# Patient Record
Sex: Female | Born: 1990
Health system: Southern US, Community
[De-identification: ages and names within clinical notes are randomized; demographics above are authoritative.]

## PROBLEM LIST (undated history)

## (undated) DIAGNOSIS — Z3490 Encounter for supervision of normal pregnancy, unspecified, unspecified trimester: Secondary | ICD-10-CM

## (undated) DIAGNOSIS — Z6740 Type O blood, Rh positive: Secondary | ICD-10-CM

## (undated) DIAGNOSIS — Z9889 Other specified postprocedural states: Secondary | ICD-10-CM

## (undated) DIAGNOSIS — T7840XA Allergy, unspecified, initial encounter: Secondary | ICD-10-CM

## (undated) HISTORY — PX: OTHER SURGICAL HISTORY: SHX169

## (undated) HISTORY — DX: Type O blood, Rh positive: Z67.40

## (undated) HISTORY — PX: ADENOIDECTOMY: SUR15

## (undated) HISTORY — DX: Allergy, unspecified, initial encounter: T78.40XA

## (undated) HISTORY — DX: Encounter for supervision of normal pregnancy, unspecified, unspecified trimester: Z34.90

## (undated) HISTORY — PX: EYE SURGERY: SHX253

## (undated) HISTORY — PX: WISDOM TOOTH EXTRACTION: SHX21

---
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
## 2013-11-03 ENCOUNTER — Other Ambulatory Visit: Payer: Self-pay | Admitting: Physician Assistant

## 2013-11-03 DIAGNOSIS — E041 Nontoxic single thyroid nodule: Secondary | ICD-10-CM

## 2013-11-07 ENCOUNTER — Ambulatory Visit
Admission: RE | Admit: 2013-11-07 | Discharge: 2013-11-07 | Disposition: A | Discharge status: Home / Self Care | Payer: BC Managed Care – PPO | Source: Ambulatory Visit | Attending: Physician Assistant | Admitting: Physician Assistant

## 2013-11-07 DIAGNOSIS — E041 Nontoxic single thyroid nodule: Secondary | ICD-10-CM

## 2015-01-22 IMAGING — US US SOFT TISSUE HEAD/NECK
1 series · 14 of 25 positions shown · non-contrast
Comparison: None.

CLINICAL DATA: Multinodular goiter

EXAM:
THYROID ULTRASOUND
TECHNIQUE: Ultrasound examination of the thyroid gland and adjacent soft
tissues was performed.

[Series 1: us soft tissue head/neck · 0.08mm/px · 14 of 53 slices shown]
[im 1/53]
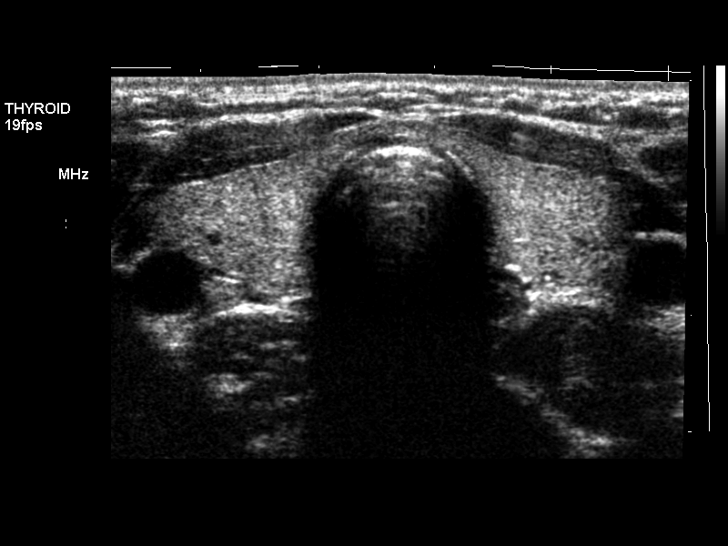
[im 5/53]
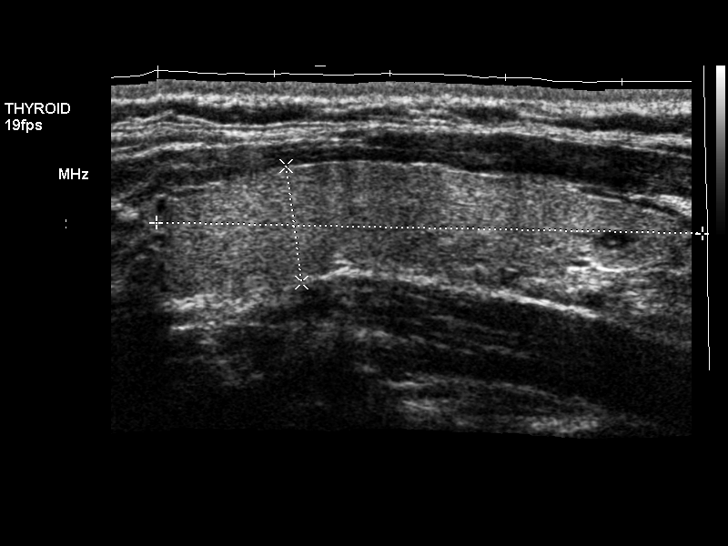
[im 9/53]
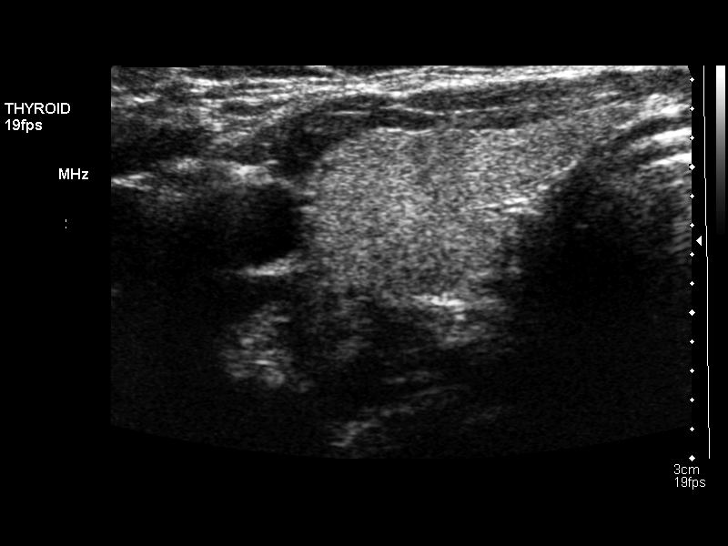
[im 14/53]
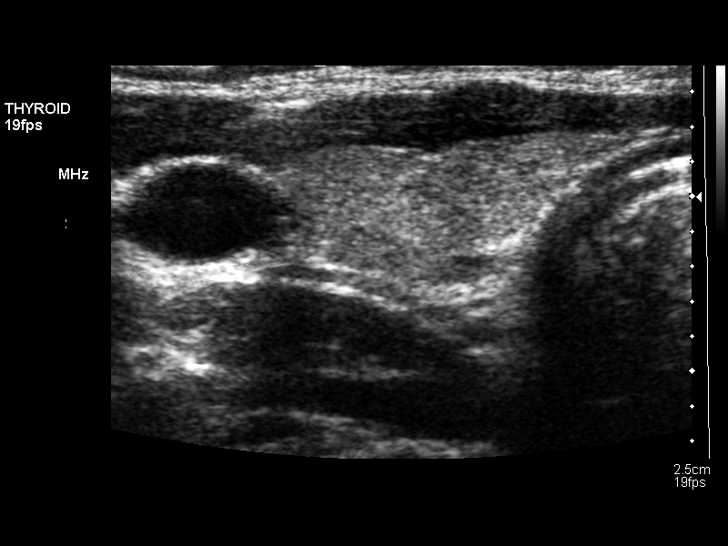
[im 18/53]
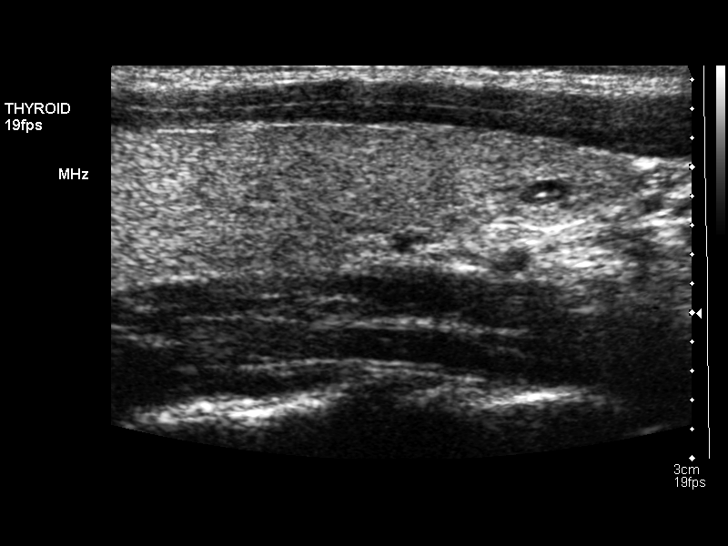
[im 20/53]
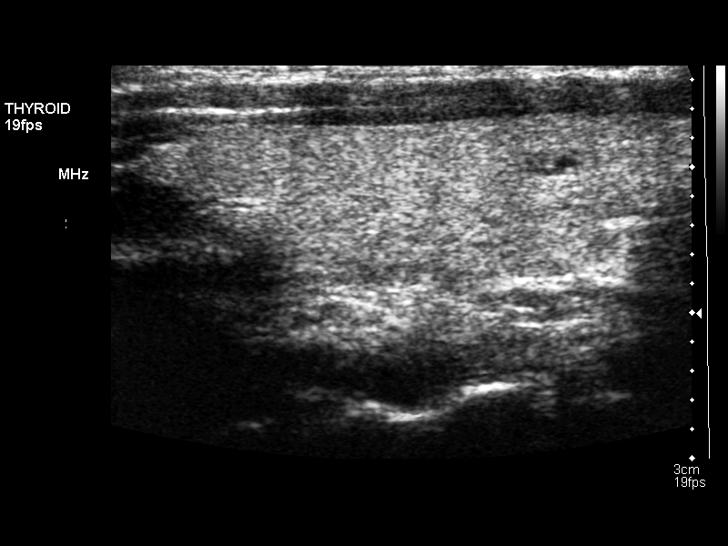
[im 24/53]
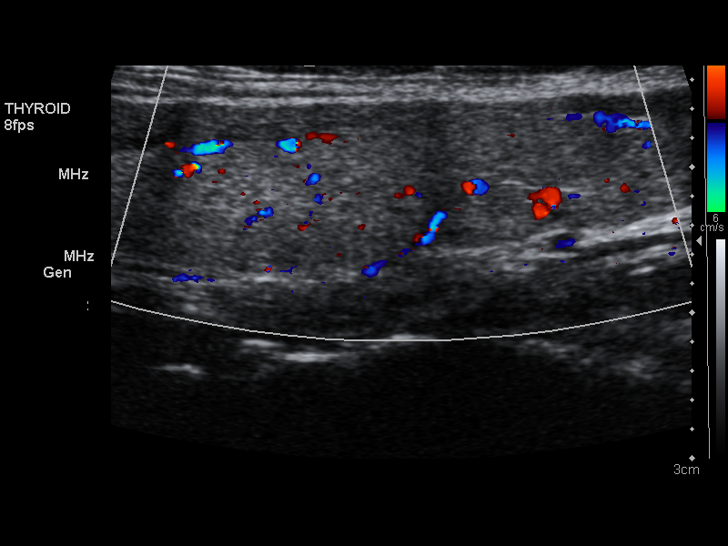
[im 29/53]
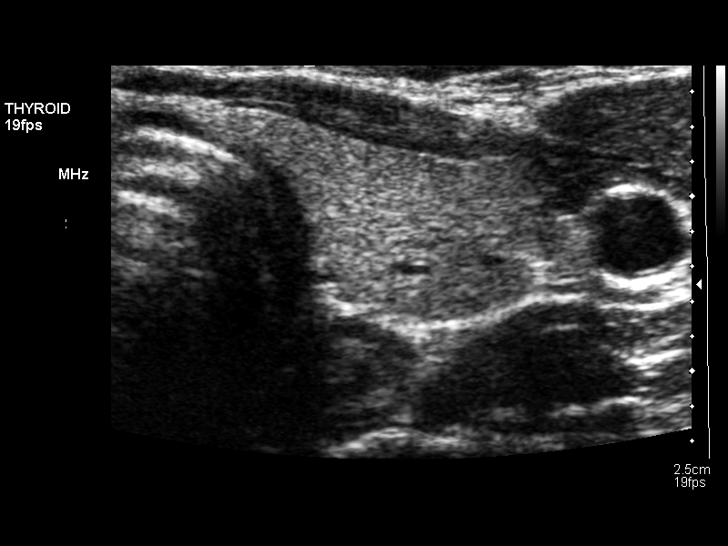
[im 33/53]
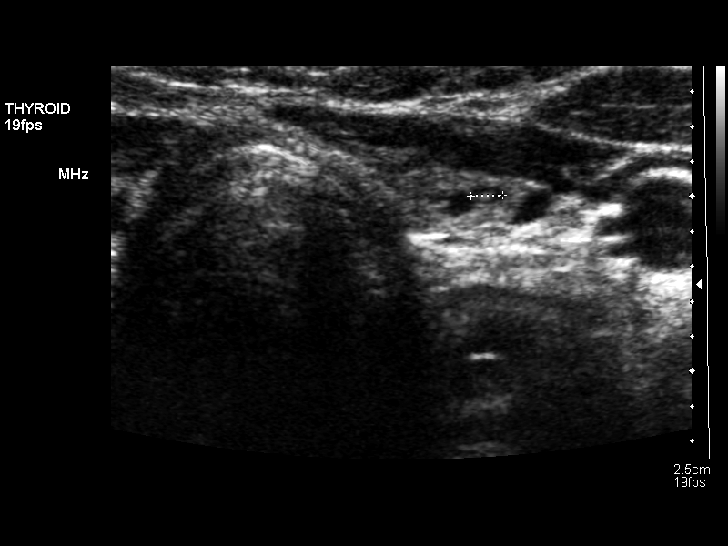
[im 35/53]
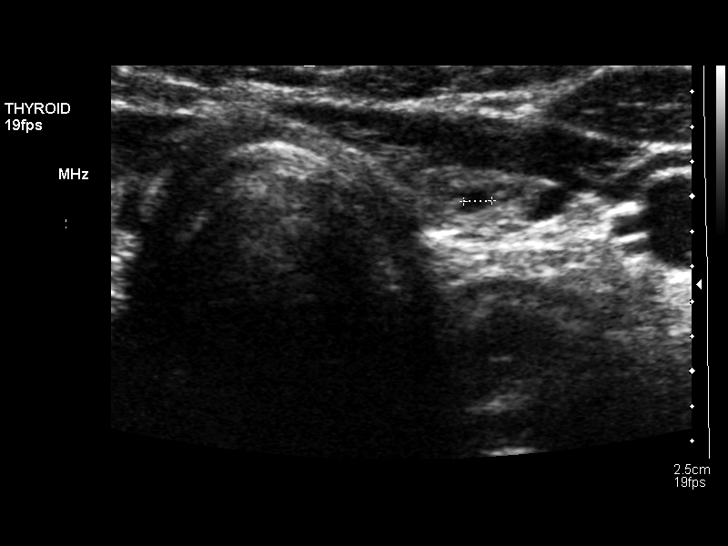
[im 40/53]
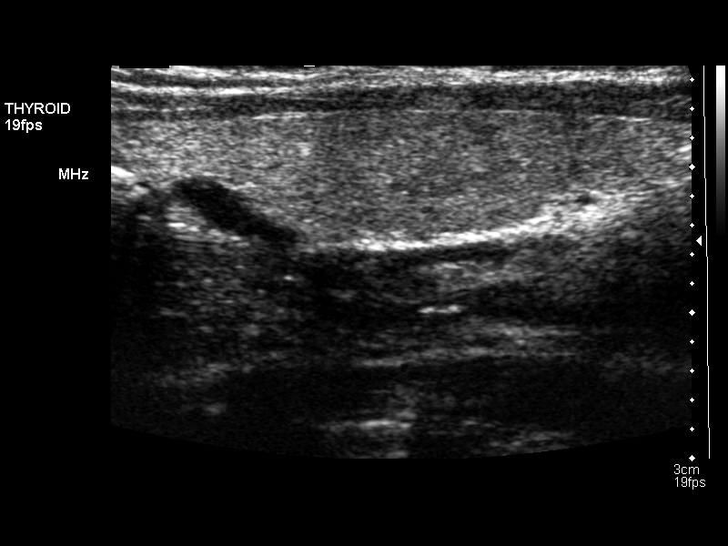
[im 44/53]
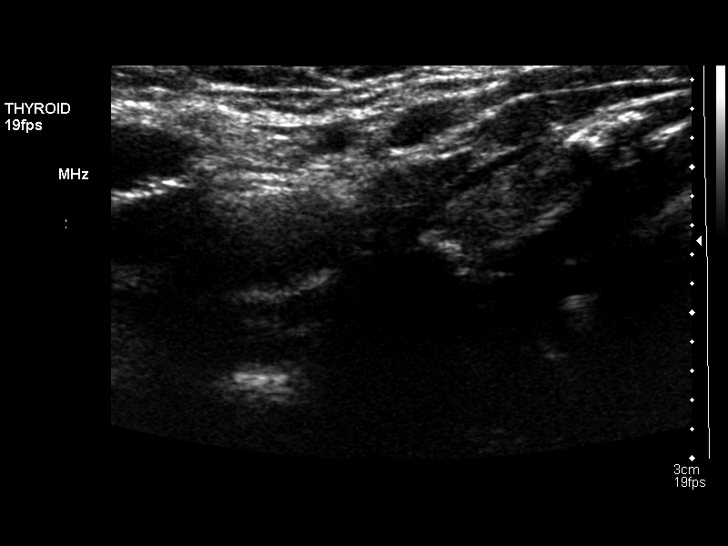
[im 48/53]
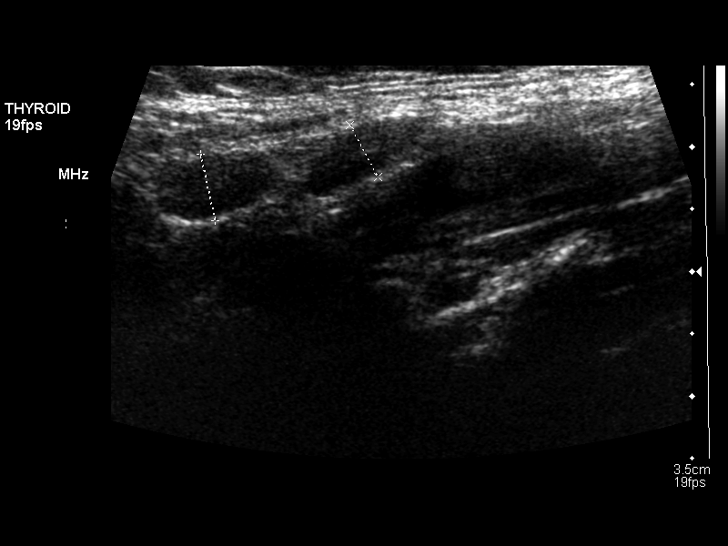
[im 53/53]
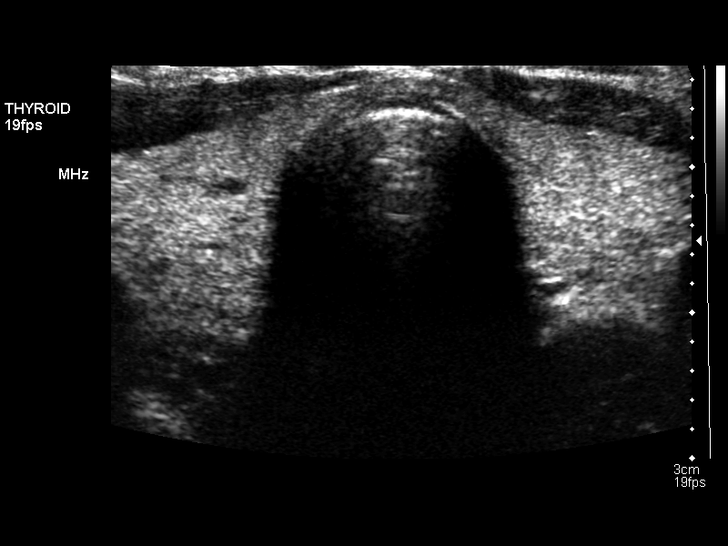

[14 of 25 positions shown; findings below may reference images not displayed]

FINDINGS: Right thyroid lobe

Measurements: 4.7 x 1.0 x 1.7 cm.. Multiple small hypoechoic nodules
are identified. The largest of these measures 4 mm.

Left thyroid lobe

Measurements: 5.2 x 1.1 x 1.5 cm.. Multiple small nodules are noted.
The largest of these measures 3.5 mm.

Isthmus

Thickness: 1.6 mm..  No nodules visualized.

Lymphadenopathy

None visualized.
IMPRESSION: Multiple small nodules bilaterally. Followup examination is
recommended to assess for stability in 1 year.

Findings do not meet current SRU consensus criteria for biopsy.
Follow-up by clinical exam is recommended.Reference: Management of
Thyroid Nodules Detected at US: Society of Radiologists in
Ultrasound Consensus Conference Statement. Radiology 8660;

## 2016-05-10 ENCOUNTER — Encounter: Payer: Self-pay | Admitting: Physician Assistant

## 2016-05-10 ENCOUNTER — Ambulatory Visit: Payer: Self-pay | Admitting: Physician Assistant

## 2016-05-10 VITALS — BP 112/70 | HR 100 | Temp 98.8°F

## 2016-05-10 DIAGNOSIS — J0100 Acute maxillary sinusitis, unspecified: Secondary | ICD-10-CM

## 2016-05-10 MED ORDER — FLUTICASONE PROPIONATE 50 MCG/ACT NA SUSP: 2.0000 | Freq: Every day | NASAL | 6 refills | Status: DC

## 2016-05-10 MED ORDER — AZITHROMYCIN 250 MG PO TABS: ORAL_TABLET | ORAL | 0 refills | Status: DC

## 2016-05-10 NOTE — Progress Notes (Signed)
S: C/o runny nose and congestion for 2 days, no fever, chills, cp/sob, v/d; mucus is yellow/green and thick, cough is sporadic, c/o of facial and dental pain.   Using otc meds:   O: PE: vitals wnl, nad, perrl eomi, normocephalic, tms dull, nasal mucosa red and swollen, throat injected, neck supple no lymph, lungs c t a, cv rrr, neuro intact  A:  Acute sinusitis   P: drink fluids, continue regular meds , use otc meds of choice, return if not improving in 5 days, return earlier if worsening , zpack flonase

## 2016-07-19 ENCOUNTER — Ambulatory Visit: Payer: Self-pay | Admitting: Physician Assistant

## 2016-07-19 VITALS — BP 120/60 | HR 82 | Temp 98.8°F

## 2016-07-19 DIAGNOSIS — R110 Nausea: Secondary | ICD-10-CM

## 2016-07-19 LAB — POCT URINALYSIS DIPSTICK
BILIRUBIN UA: NEGATIVE
GLUCOSE UA: NEGATIVE
KETONES UA: NEGATIVE
Leukocytes, UA: NEGATIVE
Nitrite, UA: NEGATIVE
Protein, UA: NEGATIVE
RBC UA: NEGATIVE
SPEC GRAV UA: 1.015
Urobilinogen, UA: 0.2
pH, UA: 5.5

## 2016-07-19 LAB — POCT URINE PREGNANCY: Preg Test, Ur: NEGATIVE

## 2016-07-19 NOTE — Progress Notes (Signed)
S: c/o nausea and hot feeling, felt like she was going to faint, no fever/chills/cough/congestion, has had a sore throat, just started her period today, checked her glucose at work and it was 130 and then later 98, no hx of diabetes, no uti sx Remainder ros neg  O: vitals wnl, nad, tms clear, throat injected, neck supple no lymph, lungs c t a, cv rrr, ua wnl, urine preg neg  A: viral illness  P: f/u if worsening, if sx continue see reg md for labs

## 2016-07-21 ENCOUNTER — Encounter: Payer: Self-pay | Admitting: Physician Assistant

## 2016-07-21 ENCOUNTER — Telehealth: Payer: Self-pay | Admitting: Physician Assistant

## 2016-07-21 ENCOUNTER — Ambulatory Visit: Payer: Self-pay | Admitting: Physician Assistant

## 2016-07-21 VITALS — BP 120/70 | HR 74 | Temp 98.4°F

## 2016-07-21 DIAGNOSIS — J0100 Acute maxillary sinusitis, unspecified: Secondary | ICD-10-CM

## 2016-07-21 MED ORDER — PREDNISONE 10 MG PO TABS: 30.0000 mg | ORAL_TABLET | Freq: Every day | ORAL | 0 refills | Status: DC

## 2016-07-21 MED ORDER — CEFDINIR 300 MG PO CAPS: 300.0000 mg | ORAL_CAPSULE | Freq: Two times a day (BID) | ORAL | 0 refills | Status: DC

## 2016-07-21 NOTE — Progress Notes (Signed)
S: C/o runny nose and congestion for 2 days, no fever, chills, cp/sob, v/d; mucus is green and thick, cough is sporadic, c/o of facial and dental pain. Nausea from last visit is better  Using otc meds:   O: PE: vitals wnl, nad, perrl eomi, normocephalic, tms dull, nasal mucosa red and swollen, throat injected, neck supple no lymph, lungs c t a, cv rrr, neuro intact, pt sounds congested today  A:  Acute sinusitis   P: drink fluids, continue regular meds , use otc meds of choice, return if not improving in 5 days, return earlier if worsening , omnicef, pred 30mg  qd

## 2016-07-21 NOTE — Telephone Encounter (Signed)
Is she just wanting nausea meds?

## 2016-08-28 ENCOUNTER — Encounter: Payer: Self-pay | Admitting: Physician Assistant

## 2016-08-28 ENCOUNTER — Ambulatory Visit: Payer: Self-pay | Admitting: Physician Assistant

## 2016-08-28 VITALS — BP 129/60 | HR 100 | Temp 98.3°F

## 2016-08-28 DIAGNOSIS — N390 Urinary tract infection, site not specified: Secondary | ICD-10-CM

## 2016-08-28 DIAGNOSIS — R319 Hematuria, unspecified: Principal | ICD-10-CM

## 2016-08-28 LAB — POCT URINALYSIS DIPSTICK
BILIRUBIN UA: NEGATIVE
Glucose, UA: NEGATIVE
Ketones, UA: NEGATIVE
NITRITE UA: NEGATIVE
PH UA: 6 (ref 5.0–8.0)
Spec Grav, UA: 1.025 (ref 1.010–1.025)
UROBILINOGEN UA: 0.2 U/dL

## 2016-08-28 MED ORDER — CIPROFLOXACIN HCL 250 MG PO TABS: 250.0000 mg | ORAL_TABLET | Freq: Two times a day (BID) | ORAL | 0 refills | Status: DC

## 2016-08-28 NOTE — Progress Notes (Addendum)
S:  C/o uti sx for 2 days, burning, urgency, frequency, denies vaginal discharge, abdominal pain or flank pain:  Remainder ros neg  O:  Vitals wnl, nad, no cva tenderness, back nontender, lungs c t a,cv rrr, n/v intact, ua 2+ leuks, +blood  A: uti  P: cipro  bid x 7d, increase water intake, add cranberry juice, return if not improving in 2 -3 days, return earlier if worsening, discussed pyelonephritis sx, rx phoned in to [facility]

## 2016-08-28 NOTE — Addendum Note (Signed)
Addended by: Faythe Ghee on: 08/28/2016 05:03 PM   Modules accepted: Orders

## 2017-03-12 ENCOUNTER — Ambulatory Visit: Payer: Self-pay | Admitting: Physician Assistant

## 2017-03-12 ENCOUNTER — Encounter: Payer: Self-pay | Admitting: Physician Assistant

## 2017-03-12 VITALS — BP 122/70 | HR 100 | Temp 98.5°F

## 2017-03-12 DIAGNOSIS — R110 Nausea: Secondary | ICD-10-CM

## 2017-03-12 LAB — POCT URINE PREGNANCY: Preg Test, Ur: NEGATIVE

## 2017-03-12 NOTE — Progress Notes (Signed)
S: c/o nausea on and off for 3-4 weeks, no fever/chills, no vomiting or diarrhea, no cough or congestion, works in [employer] so looked at her gallbladder earlier today and it was normal, nausea not associated with anything in particular  O: vitals wnl, nad, lungs c t a, cv rrr, abd soft nontender bs normal all 4 quads, urine preg negative  A: nausea  P: check 1st morning urine with ept, if negative, f/u with pcp for additional testing

## 2017-03-14 DIAGNOSIS — Z6830 Body mass index (BMI) 30.0-30.9, adult: Secondary | ICD-10-CM | POA: Diagnosis not present

## 2017-03-14 DIAGNOSIS — R112 Nausea with vomiting, unspecified: Secondary | ICD-10-CM | POA: Diagnosis not present

## 2017-04-25 DIAGNOSIS — Z6830 Body mass index (BMI) 30.0-30.9, adult: Secondary | ICD-10-CM | POA: Diagnosis not present

## 2017-04-25 DIAGNOSIS — G43A0 Cyclical vomiting, not intractable: Secondary | ICD-10-CM | POA: Diagnosis not present

## 2017-05-25 DIAGNOSIS — K219 Gastro-esophageal reflux disease without esophagitis: Secondary | ICD-10-CM | POA: Diagnosis not present

## 2017-05-25 DIAGNOSIS — Z1331 Encounter for screening for depression: Secondary | ICD-10-CM | POA: Diagnosis not present

## 2017-05-25 DIAGNOSIS — Z6829 Body mass index (BMI) 29.0-29.9, adult: Secondary | ICD-10-CM | POA: Diagnosis not present

## 2017-08-10 DIAGNOSIS — Z6829 Body mass index (BMI) 29.0-29.9, adult: Secondary | ICD-10-CM | POA: Diagnosis not present

## 2017-08-10 DIAGNOSIS — Z803 Family history of malignant neoplasm of breast: Secondary | ICD-10-CM | POA: Diagnosis not present

## 2017-08-10 DIAGNOSIS — Z800 Family history of malignant neoplasm of digestive organs: Secondary | ICD-10-CM | POA: Diagnosis not present

## 2017-08-10 DIAGNOSIS — Z8042 Family history of malignant neoplasm of prostate: Secondary | ICD-10-CM | POA: Diagnosis not present

## 2017-08-10 DIAGNOSIS — Z01419 Encounter for gynecological examination (general) (routine) without abnormal findings: Secondary | ICD-10-CM | POA: Diagnosis not present

## 2017-08-24 DIAGNOSIS — K219 Gastro-esophageal reflux disease without esophagitis: Secondary | ICD-10-CM | POA: Diagnosis not present

## 2017-08-24 DIAGNOSIS — Z6829 Body mass index (BMI) 29.0-29.9, adult: Secondary | ICD-10-CM | POA: Diagnosis not present

## 2017-09-28 DIAGNOSIS — Z809 Family history of malignant neoplasm, unspecified: Secondary | ICD-10-CM | POA: Diagnosis not present

## 2017-10-26 ENCOUNTER — Ambulatory Visit: Payer: Self-pay | Admitting: Medical

## 2017-10-26 ENCOUNTER — Encounter: Payer: Self-pay | Admitting: Medical

## 2017-10-26 VITALS — BP 124/80 | HR 79 | Temp 98.5°F | Wt 158.0 lb

## 2017-10-26 DIAGNOSIS — J0110 Acute frontal sinusitis, unspecified: Secondary | ICD-10-CM

## 2017-10-26 DIAGNOSIS — H6983 Other specified disorders of Eustachian tube, bilateral: Secondary | ICD-10-CM

## 2017-10-26 MED ORDER — AZITHROMYCIN 250 MG PO TABS: ORAL_TABLET | ORAL | 0 refills | Status: DC

## 2017-10-26 MED ORDER — FLUTICASONE PROPIONATE 50 MCG/ACT NA SUSP: 2.0000 | Freq: Every day | NASAL | 6 refills | Status: DC

## 2017-10-26 NOTE — Patient Instructions (Addendum)
Sinusitis, Adult Sinusitis is soreness and inflammation of your sinuses. Sinuses are hollow spaces in the bones around your face. They are located:  Around your eyes.  In the middle of your forehead.  Behind your nose.  In your cheekbones.  Your sinuses and nasal passages are lined with a stringy fluid (mucus). Mucus normally drains out of your sinuses. When your nasal tissues get inflamed or swollen, the mucus can get trapped or blocked so air cannot flow through your sinuses. This lets bacteria, viruses, and funguses grow, and that leads to infection. Follow these instructions at home: Medicines  Take, use, or apply over-the-counter and prescription medicines only as told by your doctor. These may include nasal sprays.  If you were prescribed an antibiotic medicine, take it as told by your doctor. Do not stop taking the antibiotic even if you start to feel better. Hydrate and Humidify  Drink enough water to keep your pee (urine) clear or pale yellow.  Use a cool mist humidifier to keep the humidity level in your home above 50%.  Breathe in steam for 10-15 minutes, 3-4 times a day or as told by your doctor. You can do this in the bathroom while a hot shower is running.  Try not to spend time in cool or dry air. Rest  Rest as much as possible.  Sleep with your head raised (elevated).  Make sure to get enough sleep each night. General instructions  Put a warm, moist washcloth on your face 3-4 times a day or as told by your doctor. This will help with discomfort.  Wash your hands often with soap and water. If there is no soap and water, use hand sanitizer.  Do not smoke. Avoid being around people who are smoking (secondhand smoke).  Keep all follow-up visits as told by your doctor. This is important. Contact a doctor if:  You have a fever.  Your symptoms get worse.  Your symptoms do not get better within 10 days. Get help right away if:  You have a very bad  headache.  You cannot stop throwing up (vomiting).  You have pain or swelling around your face or eyes.  You have trouble seeing.  You feel confused.  Your neck is stiff.  You have trouble breathing. This information is not intended to replace advice given to you by your health care provider. Make sure you discuss any questions you have with your health care provider. Document Released: 10/18/2007 Document Revised: 12/26/2015 Document Reviewed: 02/24/2015 Elsevier Interactive Patient Education  2018 [employer] Eustachian Tube Dysfunction The eustachian tube connects the middle ear to the back of the nose. It regulates air pressure in the middle ear by allowing air to move between the ear and nose. It also helps to drain fluid from the middle ear space. When the eustachian tube does not function properly, air pressure, fluid, or both can build up in the middle ear. Eustachian tube dysfunction can affect one or both ears. What are the causes? This condition happens when the eustachian tube becomes blocked or cannot open normally. This may result from:  Ear infections.  Colds and other upper respiratory infections.  Allergies.  Irritation, such as from cigarette smoke or acid from the stomach coming up into the esophagus (gastroesophageal reflux).  Sudden changes in air pressure, such as from descending in an airplane.  Abnormal growths in the nose or throat, such as nasal polyps, tumors, or enlarged tissue at the back of the throat (adenoids).    What increases the risk? This condition may be more likely to develop in people who smoke and people who are overweight. Eustachian tube dysfunction may also be more likely to develop in children, especially children who have:  Certain birth defects of the mouth, such as cleft palate.  Large tonsils and adenoids.  What are the signs or symptoms? Symptoms of this condition may include:  A feeling of fullness in the ear.  Ear  pain.  Clicking or popping noises in the ear.  Ringing in the ear.  Hearing loss.  Loss of balance.  Symptoms may get worse when the air pressure around you changes, such as when you travel to an area of high elevation or fly on an airplane. How is this diagnosed? This condition may be diagnosed based on:  Your symptoms.  A physical exam of your ear, nose, and throat.  Tests, such as those that measure: ? The movement of your eardrum (tympanogram). ? Your hearing (audiometry).  How is this treated? Treatment depends on the cause and severity of your condition. If your symptoms are mild, you may be able to relieve your symptoms by moving air into ("popping") your ears. If you have symptoms of fluid in your ears, treatment may include:  Decongestants.  Antihistamines.  Nasal sprays or ear drops that contain medicines that reduce swelling (steroids).  In some cases, you may need to have a procedure to drain the fluid in your eardrum (myringotomy). In this procedure, a small tube is placed in the eardrum to:  Drain the fluid.  Restore the air in the middle ear space.  Follow these instructions at home:  Take over-the-counter and prescription medicines only as told by your health care provider.  Use techniques to help pop your ears as recommended by your health care provider. These may include: ? Chewing gum. ? Yawning. ? Frequent, forceful swallowing. ? Closing your mouth, holding your nose closed, and gently blowing as if you are trying to blow air out of your nose.  Do not do any of the following until your health care provider approves: ? Travel to high altitudes. ? Fly in airplanes. ? Work in a pressurized cabin or room. ? Scuba dive.  Keep your ears dry. Dry your ears completely after showering or bathing.  Do not smoke.  Keep all follow-up visits as told by your health care provider. This is important. Contact a health care provider if:  Your symptoms do  not go away after treatment.  Your symptoms come back after treatment.  You are unable to pop your ears.  You have: ? A fever. ? Pain in your ear. ? Pain in your head or neck. ? Fluid draining from your ear.  Your hearing suddenly changes.  You become very dizzy.  You lose your balance. This information is not intended to replace advice given to you by your health care provider. Make sure you discuss any questions you have with your health care provider. Document Released: 05/28/2015 Document Revised: 10/07/2015 Document Reviewed: 05/20/2014 Elsevier Interactive Patient Education  2018 Elsevier Inc.  

## 2017-10-26 NOTE — Addendum Note (Signed)
Addended by: Ellie LunchATCLIFFE, Dasja Brase R on: 10/26/2017 09:38 AM   Modules accepted: Orders

## 2017-10-26 NOTE — Progress Notes (Signed)
   Subjective:    Patient ID: Kelly Crawford, female    DOB: May 13, 1991, 27 y.o.   MRN: [id number]  HPI 27 yo female in non acute distress. Complains of initially st ,PND, runny nose and sneezing x  7days  and  Now  facial pressure and ears (yesterday) feel like she is in a tunnel, green discharge from the throat. Using Mucinexx and Allergra. Denies any shortness of breath or chest pain, fever or chills. Review of Systems  Constitutional: Negative for chills and fever.  HENT: Positive for congestion, postnasal drip, rhinorrhea, sinus pain (forehead and maxillary regions), sneezing, sore throat (off and on) and voice change (hoarse). Negative for ear discharge and ear pain.   Eyes: Negative for discharge, itching and visual disturbance.  Respiratory: Positive for cough (productive green). Negative for shortness of breath.   Cardiovascular: Negative for chest pain, palpitations and leg swelling.  Gastrointestinal: Negative for abdominal pain, diarrhea, nausea and vomiting.  Endocrine: Negative for polydipsia, polyphagia and polyuria.  Genitourinary: Negative for dysuria.  Musculoskeletal: Negative for myalgias.  Skin: Negative for rash.  Allergic/Immunologic: Positive for environmental allergies and food allergies (milk hives beter now then when she was a child).  Neurological: Positive for headaches. Negative for dizziness, syncope and light-headedness.  Hematological: Negative for adenopathy.  Psychiatric/Behavioral: Negative for behavioral problems, hallucinations, self-injury and suicidal ideas.   Pt not pregnant or trying to conceive. Has PCP in [place].    Objective:   Physical Exam  Constitutional: She is oriented to person, place, and time. She appears well-developed and well-nourished.  HENT:  Head: Normocephalic and atraumatic.  Right Ear: Hearing, external ear and ear canal normal. A middle ear effusion is present.  Left Ear: Hearing, external ear and ear canal normal. A  middle ear effusion is present.  Nose: Mucosal edema and rhinorrhea present. Right sinus exhibits frontal sinus tenderness. Left sinus exhibits frontal sinus tenderness.  Mouth/Throat: Oropharynx is clear and moist.  Eyes: Pupils are equal, round, and reactive to light. Conjunctivae, EOM and lids are normal.  Neck: Normal range of motion.  Cardiovascular: Normal rate, regular rhythm and normal heart sounds. Exam reveals no gallop and no friction rub.  No murmur heard. Pulmonary/Chest: Effort normal and breath sounds normal.  Lymphadenopathy:    She has cervical adenopathy.  Neurological: She is alert and oriented to person, place, and time.  Skin: Skin is warm and dry.  Psychiatric: She has a normal mood and affect. Her behavior is normal. Judgment and thought content normal.  Nursing note and vitals reviewed.         Assessment & Plan:  Sinusitis, eustachian tube dysfunction Continue Allegra and Mucinex as directed. Take OTC Motrin or Tylenol as directed for headache and general not feeling well. Meds ordered this encounter  Medications  . azithromycin (ZITHROMAX) 250 MG tablet    Sig: Take two tablet by mouth today then one tablet days 2-5, take with food    Dispense:  6 tablet    Refill:  0  . fluticasone (FLONASE) 50 MCG/ACT nasal spray    Sig: Place 2 sprays into both nostrils daily.    Dispense:  16 g    Refill:  6  Return in 3-5 days if not improving. Patient verbalizes understanding and has no questions at discharge.

## 2017-10-30 ENCOUNTER — Telehealth: Payer: Self-pay | Admitting: Emergency Medicine

## 2017-10-30 NOTE — Telephone Encounter (Signed)
Left message follow up call from visit with [facility]. 

## 2018-05-13 DIAGNOSIS — H5213 Myopia, bilateral: Secondary | ICD-10-CM | POA: Diagnosis not present

## 2018-06-05 LAB — HM HEPATITIS C SCREENING LAB: HM Hepatitis Screen: NEGATIVE

## 2018-06-07 LAB — HM PAP SMEAR: HM Pap smear: NORMAL

## 2018-06-24 ENCOUNTER — Encounter: Payer: Self-pay | Admitting: Internal Medicine

## 2018-10-04 ENCOUNTER — Encounter: Payer: Self-pay | Admitting: Internal Medicine

## 2018-10-04 LAB — HIV ANTIBODY (ROUTINE TESTING W REFLEX): HIV 1&2 Ab, 4th Generation: NORMAL

## 2018-10-04 LAB — HM HIV SCREENING LAB: HM HIV Screening: NEGATIVE

## 2018-10-24 ENCOUNTER — Telehealth: Payer: Self-pay

## 2018-10-24 NOTE — Telephone Encounter (Signed)
Copied from [place] [id number]. Topic: General - Other >> Oct 23, 2018  4:19 PM Virl Axe D wrote: Reason for CRM: Pt returned VM regarding appt on 10/25/18. Office closed. Please advise.

## 2018-10-25 ENCOUNTER — Ambulatory Visit (INDEPENDENT_AMBULATORY_CARE_PROVIDER_SITE_OTHER): Payer: No Typology Code available for payment source | Admitting: Internal Medicine

## 2018-10-25 ENCOUNTER — Encounter: Payer: Self-pay | Admitting: Internal Medicine

## 2018-10-25 ENCOUNTER — Other Ambulatory Visit: Payer: Self-pay

## 2018-10-25 DIAGNOSIS — E611 Iron deficiency: Secondary | ICD-10-CM | POA: Diagnosis not present

## 2018-10-25 DIAGNOSIS — Z3A30 30 weeks gestation of pregnancy: Secondary | ICD-10-CM

## 2018-10-25 DIAGNOSIS — Z0000 Encounter for general adult medical examination without abnormal findings: Secondary | ICD-10-CM | POA: Diagnosis not present

## 2018-10-25 DIAGNOSIS — Z13220 Encounter for screening for lipoid disorders: Secondary | ICD-10-CM | POA: Diagnosis not present

## 2018-10-25 DIAGNOSIS — E559 Vitamin D deficiency, unspecified: Secondary | ICD-10-CM

## 2018-10-25 DIAGNOSIS — Z1329 Encounter for screening for other suspected endocrine disorder: Secondary | ICD-10-CM

## 2018-10-25 NOTE — Progress Notes (Addendum)
Virtual Visit via Video Note  I connected with Kelly Crawford   on 10/25/18 at  3:47 PM EDT by a video enabled telemedicine application and verified that I am speaking with the correct person using two identifiers.  Location patient: work [employer] Persons participating in the virtual visit: patient, provider  I discussed the limitations of evaluation and management by telemedicine and the availability of in person appointments. The patient expressed understanding and agreed to proceed.   HPI: 1. Annual doing well no issues she is pregnant [redacted] weeks and due 12/29/18 ob/gyn Dr. Gaetano Net [facility] in [place]   ROS: See pertinent positives and negatives per HPI. General: weight gain with pregnancy  HEENT: no sore throat  CV: no chest pain  Lungs: no sob  Ab: no abdominal pain  Neuro: no h/a/dizziness  Psych: no depression/anxiety   +pregnancy  Past Medical History:  Diagnosis Date  . Allergy    bee venom, pollen, dust, mold and mildew  . Pregnancy    due 12/29/2018 1st child    Past Surgical History:  Procedure Laterality Date  . ADENOIDECTOMY    . tubes in ears    . WISDOM TOOTH EXTRACTION      Family History  Problem Relation Age of Onset  . Cancer Mother        breast dx'ed age 45  . Hyperlipidemia Maternal Grandmother   . Hypertension Maternal Grandmother   . Diabetes Maternal Grandmother        pre  . Cancer Maternal Grandfather        prostate  . Hypertension Maternal Grandfather   . Hyperlipidemia Maternal Grandfather   . Diabetes Maternal Grandfather        pre    SOCIAL HX:  Married  Expected baby girl 12/29/2018  Works [employer] in [place] dept  Likes spending time with family and shopping  From [place] [phone]  Current Outpatient Medications:  .  Docosahexaenoic Acid (PRENATAL DHA) 200 MG CAPS, Prenatal + DHA, Disp: , Rfl:  .  etonogestrel-ethinyl estradiol (NUVARING) 0.12-0.015 MG/24HR vaginal ring, Place 1 each vaginally  every 28 (twenty-eight) days. Insert vaginally and leave in place for 3 consecutive weeks, then remove for 1 week., Disp: , Rfl:   EXAM:  VITALS per patient if applicable:  GENERAL: alert, oriented, appears well and in no acute distress  HEENT: atraumatic, conjunttiva clear, no obvious abnormalities on inspection of external nose and ears  NECK: normal movements of the head and neck  LUNGS: on inspection no signs of respiratory distress, breathing rate appears normal, no obvious gross SOB, gasping or wheezing  CV: no obvious cyanosis  MS: moves all visible extremities without noticeable abnormality  PSYCH/NEURO: pleasant and cooperative, no obvious depression or anxiety, speech and thought processing grossly intact  ASSESSMENT AND PLAN:  Discussed the following assessment and plan:  Annual physical exam - Plan: rec healthy diet and exercise  Check flu in future Tdap had 10/04/18   Pap per pt had 06/2018 need to copy copy requested today from ob/gyn  -pap 06/07/18 negative no HPV testing done scanned into chart   Will check with Ob/gyn for labs and if not done check CMET, CBC, iron, TSH, T4, UA, vitamin D, lipid, MMR, hep B  -HIV neg 10/04/18, HCV neg 05/30/18, HBsAg negative  CBC 8.3 13.0/38.6/190  Blood type O+ Rubella abs 3.69 + [redacted] weeks gestation of pregnancy - Plan: f/u ob/gyn Dr. Gaetano Net  Flu shot  Tdap  I discussed the assessment and treatment plan with the patient. The patient was provided an opportunity to ask questions and all were answered. The patient agreed with the plan and demonstrated an understanding of the instructions.   The patient was advised to call back or seek an in-person evaluation if the symptoms worsen or if the condition fails to improve as anticipated.  Time spent 15 minutes  Delorise Jackson, MD

## 2018-10-25 NOTE — Patient Instructions (Signed)

## 2018-11-01 NOTE — Addendum Note (Signed)
Addended by: Orland Mustard on: 11/01/2018 06:22 PM   Modules accepted: Orders

## 2018-11-04 ENCOUNTER — Telehealth: Payer: Self-pay | Admitting: Internal Medicine

## 2018-11-04 NOTE — Telephone Encounter (Signed)
Called pt and left vm for pt to call ofc to schedule fasting labs for this Friday and f/u in 7-8 month.

## 2018-11-04 NOTE — Progress Notes (Signed)
Called pt and left msg for pt to call ofc to schedule fasting labs for Friday.

## 2018-11-22 ENCOUNTER — Other Ambulatory Visit (INDEPENDENT_AMBULATORY_CARE_PROVIDER_SITE_OTHER): Payer: No Typology Code available for payment source

## 2018-11-22 ENCOUNTER — Other Ambulatory Visit: Payer: Self-pay

## 2018-11-22 ENCOUNTER — Encounter: Payer: Self-pay | Admitting: Internal Medicine

## 2018-11-22 DIAGNOSIS — Z0000 Encounter for general adult medical examination without abnormal findings: Secondary | ICD-10-CM

## 2018-11-22 DIAGNOSIS — E611 Iron deficiency: Secondary | ICD-10-CM

## 2018-11-22 DIAGNOSIS — Z1329 Encounter for screening for other suspected endocrine disorder: Secondary | ICD-10-CM | POA: Diagnosis not present

## 2018-11-22 DIAGNOSIS — E785 Hyperlipidemia, unspecified: Secondary | ICD-10-CM | POA: Insufficient documentation

## 2018-11-22 DIAGNOSIS — Z13220 Encounter for screening for lipoid disorders: Secondary | ICD-10-CM | POA: Diagnosis not present

## 2018-11-22 DIAGNOSIS — E559 Vitamin D deficiency, unspecified: Secondary | ICD-10-CM | POA: Insufficient documentation

## 2018-11-22 LAB — COMPREHENSIVE METABOLIC PANEL
ALT: 17 U/L (ref 0–35)
AST: 15 U/L (ref 0–37)
Albumin: 3.5 g/dL (ref 3.5–5.2)
Alkaline Phosphatase: 108 U/L (ref 39–117)
BUN: 11 mg/dL (ref 6–23)
CO2: 22 mEq/L (ref 19–32)
Calcium: 8.4 mg/dL (ref 8.4–10.5)
Chloride: 106 mEq/L (ref 96–112)
Creatinine, Ser: 0.67 mg/dL (ref 0.40–1.20)
GFR: 105.56 mL/min (ref >60.00)
Glucose, Bld: 76 mg/dL (ref 70–99)
Potassium: 4.1 mEq/L (ref 3.5–5.1)
Sodium: 138 mEq/L (ref 135–145)
Total Bilirubin: 0.7 mg/dL (ref 0.2–1.2)
Total Protein: 6.1 g/dL (ref 6.0–8.3)

## 2018-11-22 LAB — LIPID PANEL
Cholesterol: 276 mg/dL — ABNORMAL HIGH (ref 0–200)
HDL: 93.7 mg/dL (ref >39.00)
NonHDL: 182.44
Total CHOL/HDL Ratio: 3
Triglycerides: 214 mg/dL — ABNORMAL HIGH (ref 0.0–149.0)
VLDL: 42.8 mg/dL — ABNORMAL HIGH (ref 0.0–40.0)

## 2018-11-22 LAB — IBC + FERRITIN
Ferritin: 27.3 ng/mL (ref 10.0–291.0)
Iron: 153 ug/dL — ABNORMAL HIGH (ref 42–145)
Saturation Ratios: 29.7 % (ref 20.0–50.0)
Transferrin: 368 mg/dL — ABNORMAL HIGH (ref 212.0–360.0)

## 2018-11-22 LAB — CBC WITH DIFFERENTIAL/PLATELET
Basophils Absolute: 0 10*3/uL (ref 0.0–0.1)
Basophils Relative: 0.4 % (ref 0.0–3.0)
Eosinophils Absolute: 0.1 10*3/uL (ref 0.0–0.7)
Eosinophils Relative: 1.1 % (ref 0.0–5.0)
HCT: 42.2 % (ref 36.0–46.0)
Hemoglobin: 14.3 g/dL (ref 12.0–15.0)
Lymphocytes Relative: 18.4 % (ref 12.0–46.0)
Lymphs Abs: 1.5 10*3/uL (ref 0.7–4.0)
MCHC: 33.9 g/dL (ref 30.0–36.0)
MCV: 98.2 fl (ref 78.0–100.0)
Monocytes Absolute: 0.7 10*3/uL (ref 0.1–1.0)
Monocytes Relative: 8 % (ref 3.0–12.0)
Neutro Abs: 6 10*3/uL (ref 1.4–7.7)
Neutrophils Relative %: 72.1 % (ref 43.0–77.0)
Platelets: 176 10*3/uL (ref 150.0–400.0)
RBC: 4.29 Mil/uL (ref 3.87–5.11)
RDW: 13.7 % (ref 11.5–15.5)
WBC: 8.3 10*3/uL (ref 4.0–10.5)

## 2018-11-22 LAB — LDL CHOLESTEROL, DIRECT: Direct LDL: 169 mg/dL

## 2018-11-22 LAB — TSH: TSH: 2.06 u[IU]/mL (ref 0.35–4.50)

## 2018-11-22 LAB — VITAMIN D 25 HYDROXY (VIT D DEFICIENCY, FRACTURES): VITD: 24.44 ng/mL — ABNORMAL LOW (ref 30.00–100.00)

## 2018-11-22 LAB — T4, FREE: Free T4: 0.65 ng/dL (ref 0.60–1.60)

## 2018-12-06 LAB — OB RESULTS CONSOLE GBS: GBS: NEGATIVE

## 2018-12-08 ENCOUNTER — Other Ambulatory Visit: Payer: Self-pay

## 2018-12-08 ENCOUNTER — Encounter (HOSPITAL_COMMUNITY): Payer: Self-pay

## 2018-12-08 ENCOUNTER — Inpatient Hospital Stay (HOSPITAL_COMMUNITY): Payer: No Typology Code available for payment source | Admitting: Anesthesiology

## 2018-12-08 ENCOUNTER — Inpatient Hospital Stay (HOSPITAL_COMMUNITY)
Admission: AD | Admit: 2018-12-08 | Discharge: 2018-12-12 | DRG: 788 | Disposition: A | Discharge status: Home / Self Care | Payer: No Typology Code available for payment source | Attending: Obstetrics and Gynecology | Admitting: Obstetrics and Gynecology

## 2018-12-08 ENCOUNTER — Encounter (HOSPITAL_COMMUNITY)
Admission: AD | Disposition: A | Discharge status: Home / Self Care | Payer: Self-pay | Source: Home / Self Care | Attending: Obstetrics and Gynecology

## 2018-12-08 DIAGNOSIS — Z3A37 37 weeks gestation of pregnancy: Secondary | ICD-10-CM

## 2018-12-08 DIAGNOSIS — Z20828 Contact with and (suspected) exposure to other viral communicable diseases: Secondary | ICD-10-CM | POA: Diagnosis present

## 2018-12-08 DIAGNOSIS — O26893 Other specified pregnancy related conditions, third trimester: Secondary | ICD-10-CM | POA: Diagnosis present

## 2018-12-08 LAB — TYPE AND SCREEN
ABO/RH(D): O POS
Antibody Screen: NEGATIVE

## 2018-12-08 LAB — CBC
HCT: 41.5 % (ref 36.0–46.0)
Hemoglobin: 14.1 g/dL (ref 12.0–15.0)
MCH: 33.1 pg (ref 26.0–34.0)
MCHC: 34 g/dL (ref 30.0–36.0)
MCV: 97.4 fL (ref 80.0–100.0)
Platelets: 173 10*3/uL (ref 150–400)
RBC: 4.26 MIL/uL (ref 3.87–5.11)
RDW: 13.2 % (ref 11.5–15.5)
WBC: 8.7 10*3/uL (ref 4.0–10.5)
nRBC: 0 % (ref 0.0–0.2)

## 2018-12-08 LAB — SARS CORONAVIRUS 2 BY RT PCR (HOSPITAL ORDER, PERFORMED IN ~~LOC~~ HOSPITAL LAB): SARS Coronavirus 2: NEGATIVE

## 2018-12-08 LAB — POCT FERN TEST: POCT Fern Test: POSITIVE

## 2018-12-08 SURGERY — Surgical Case
Anesthesia: Spinal

## 2018-12-08 MED ORDER — NALOXONE HCL 0.4 MG/ML IJ SOLN: 0.4000 mg | INTRAMUSCULAR | Status: DC | PRN

## 2018-12-08 MED ORDER — MORPHINE SULFATE (PF) 0.5 MG/ML IJ SOLN
INTRAMUSCULAR | Status: DC | PRN
  Administered 2018-12-08: .15 mg via INTRATHECAL

## 2018-12-08 MED ORDER — DIPHENHYDRAMINE HCL 25 MG PO CAPS: 25.0000 mg | ORAL_CAPSULE | ORAL | Status: DC | PRN

## 2018-12-08 MED ORDER — OXYCODONE HCL 5 MG PO TABS
5.0000 mg | ORAL_TABLET | ORAL | Status: DC | PRN
  Administered 2018-12-11 – 2018-12-12 (×4): 5 mg via ORAL
  Filled 2018-12-08 (×3): qty 1

## 2018-12-08 MED ORDER — SOD CITRATE-CITRIC ACID 500-334 MG/5ML PO SOLN
30.0000 mL | ORAL | Status: DC | PRN
  Administered 2018-12-08: 30 mL via ORAL
  Filled 2018-12-08: qty 30

## 2018-12-08 MED ORDER — MEASLES, MUMPS & RUBELLA VAC IJ SOLR: 0.5000 mL | Freq: Once | INTRAMUSCULAR | Status: DC

## 2018-12-08 MED ORDER — NALBUPHINE HCL 10 MG/ML IJ SOLN: 5.0000 mg | INTRAMUSCULAR | Status: DC | PRN

## 2018-12-08 MED ORDER — LACTATED RINGERS IV SOLN
INTRAVENOUS | Status: DC
  Administered 2018-12-09: 01:00:00 via INTRAVENOUS

## 2018-12-08 MED ORDER — SODIUM CHLORIDE 0.9% FLUSH: 3.0000 mL | INTRAVENOUS | Status: DC | PRN

## 2018-12-08 MED ORDER — ONDANSETRON HCL 4 MG/2ML IJ SOLN
INTRAMUSCULAR | Status: AC
  Filled 2018-12-08: qty 2

## 2018-12-08 MED ORDER — ZOLPIDEM TARTRATE 5 MG PO TABS: 5.0000 mg | ORAL_TABLET | Freq: Every evening | ORAL | Status: DC | PRN

## 2018-12-08 MED ORDER — IBUPROFEN 600 MG PO TABS
600.0000 mg | ORAL_TABLET | Freq: Four times a day (QID) | ORAL | Status: DC | PRN
  Administered 2018-12-09 – 2018-12-12 (×11): 600 mg via ORAL
  Filled 2018-12-08 (×11): qty 1

## 2018-12-08 MED ORDER — KETOROLAC TROMETHAMINE 30 MG/ML IJ SOLN: 30.0000 mg | Freq: Four times a day (QID) | INTRAMUSCULAR | Status: AC | PRN

## 2018-12-08 MED ORDER — MORPHINE SULFATE (PF) 0.5 MG/ML IJ SOLN
INTRAMUSCULAR | Status: AC
  Filled 2018-12-08: qty 10

## 2018-12-08 MED ORDER — ONDANSETRON HCL 4 MG/2ML IJ SOLN: 4.0000 mg | Freq: Four times a day (QID) | INTRAMUSCULAR | Status: DC | PRN

## 2018-12-08 MED ORDER — PHENYLEPHRINE HCL-NACL 20-0.9 MG/250ML-% IV SOLN
INTRAVENOUS | Status: DC | PRN
  Administered 2018-12-08: 60 ug/min via INTRAVENOUS

## 2018-12-08 MED ORDER — FENTANYL CITRATE (PF) 100 MCG/2ML IJ SOLN: 25.0000 ug | INTRAMUSCULAR | Status: DC | PRN

## 2018-12-08 MED ORDER — WITCH HAZEL-GLYCERIN EX PADS: 1.0000 "application " | MEDICATED_PAD | CUTANEOUS | Status: DC | PRN

## 2018-12-08 MED ORDER — ACETAMINOPHEN 10 MG/ML IV SOLN
1000.0000 mg | Freq: Once | INTRAVENOUS | Status: DC | PRN
  Administered 2018-12-08: 1000 mg via INTRAVENOUS

## 2018-12-08 MED ORDER — ONDANSETRON HCL 4 MG/2ML IJ SOLN
INTRAMUSCULAR | Status: DC | PRN
  Administered 2018-12-08: 4 mg via INTRAVENOUS

## 2018-12-08 MED ORDER — DIBUCAINE (PERIANAL) 1 % EX OINT: 1.0000 "application " | TOPICAL_OINTMENT | CUTANEOUS | Status: DC | PRN

## 2018-12-08 MED ORDER — SIMETHICONE 80 MG PO CHEW
80.0000 mg | CHEWABLE_TABLET | Freq: Three times a day (TID) | ORAL | Status: DC
  Administered 2018-12-09 – 2018-12-12 (×8): 80 mg via ORAL
  Filled 2018-12-08 (×9): qty 1

## 2018-12-08 MED ORDER — OXYTOCIN BOLUS FROM INFUSION: 500.0000 mL | Freq: Once | INTRAVENOUS | Status: DC

## 2018-12-08 MED ORDER — ONDANSETRON HCL 4 MG/2ML IJ SOLN: 4.0000 mg | Freq: Three times a day (TID) | INTRAMUSCULAR | Status: DC | PRN

## 2018-12-08 MED ORDER — DIPHENHYDRAMINE HCL 25 MG PO CAPS: 25.0000 mg | ORAL_CAPSULE | Freq: Four times a day (QID) | ORAL | Status: DC | PRN

## 2018-12-08 MED ORDER — SIMETHICONE 80 MG PO CHEW
80.0000 mg | CHEWABLE_TABLET | ORAL | Status: DC
  Administered 2018-12-08 – 2018-12-11 (×3): 80 mg via ORAL
  Filled 2018-12-08 (×4): qty 1

## 2018-12-08 MED ORDER — OXYCODONE-ACETAMINOPHEN 5-325 MG PO TABS: 2.0000 | ORAL_TABLET | ORAL | Status: DC | PRN

## 2018-12-08 MED ORDER — NALBUPHINE HCL 10 MG/ML IJ SOLN: 5.0000 mg | Freq: Once | INTRAMUSCULAR | Status: DC | PRN

## 2018-12-08 MED ORDER — OXYTOCIN 40 UNITS IN NORMAL SALINE INFUSION - SIMPLE MED
INTRAVENOUS | Status: AC
  Filled 2018-12-08: qty 1000

## 2018-12-08 MED ORDER — DIPHENHYDRAMINE HCL 50 MG/ML IJ SOLN: 12.5000 mg | INTRAMUSCULAR | Status: DC | PRN

## 2018-12-08 MED ORDER — KETOROLAC TROMETHAMINE 30 MG/ML IJ SOLN
30.0000 mg | Freq: Once | INTRAMUSCULAR | Status: AC | PRN
  Administered 2018-12-08: 30 mg via INTRAVENOUS

## 2018-12-08 MED ORDER — BUPIVACAINE IN DEXTROSE 0.75-8.25 % IT SOLN
INTRATHECAL | Status: DC | PRN
  Administered 2018-12-08: 1.6 mL via INTRATHECAL

## 2018-12-08 MED ORDER — LACTATED RINGERS IV SOLN
INTRAVENOUS | Status: DC
  Administered 2018-12-08 (×2): via INTRAVENOUS

## 2018-12-08 MED ORDER — SIMETHICONE 80 MG PO CHEW: 80.0000 mg | CHEWABLE_TABLET | ORAL | Status: DC | PRN

## 2018-12-08 MED ORDER — ACETAMINOPHEN 10 MG/ML IV SOLN
INTRAVENOUS | Status: AC
  Filled 2018-12-08: qty 100

## 2018-12-08 MED ORDER — OXYTOCIN 40 UNITS IN NORMAL SALINE INFUSION - SIMPLE MED: 2.5000 [IU]/h | INTRAVENOUS | Status: AC

## 2018-12-08 MED ORDER — ACETAMINOPHEN 325 MG PO TABS: 650.0000 mg | ORAL_TABLET | ORAL | Status: DC | PRN

## 2018-12-08 MED ORDER — NALOXONE HCL 4 MG/10ML IJ SOLN
1.0000 ug/kg/h | INTRAVENOUS | Status: DC | PRN
  Filled 2018-12-08: qty 5

## 2018-12-08 MED ORDER — FENTANYL CITRATE (PF) 100 MCG/2ML IJ SOLN
INTRAMUSCULAR | Status: DC | PRN
  Administered 2018-12-08: 15 ug via INTRATHECAL

## 2018-12-08 MED ORDER — SODIUM CHLORIDE 0.9 % IV SOLN
INTRAVENOUS | Status: DC | PRN
  Administered 2018-12-08: 14:00:00 via INTRAVENOUS

## 2018-12-08 MED ORDER — ACETAMINOPHEN 500 MG PO TABS
1000.0000 mg | ORAL_TABLET | Freq: Four times a day (QID) | ORAL | Status: AC
  Administered 2018-12-08 – 2018-12-09 (×3): 1000 mg via ORAL
  Filled 2018-12-08 (×3): qty 2

## 2018-12-08 MED ORDER — PRENATAL MULTIVITAMIN CH
1.0000 | ORAL_TABLET | Freq: Every day | ORAL | Status: DC
  Administered 2018-12-09 – 2018-12-11 (×2): 1 via ORAL
  Filled 2018-12-08 (×3): qty 1

## 2018-12-08 MED ORDER — FLEET ENEMA 7-19 GM/118ML RE ENEM: 1.0000 | ENEMA | RECTAL | Status: DC | PRN

## 2018-12-08 MED ORDER — FENTANYL CITRATE (PF) 100 MCG/2ML IJ SOLN
INTRAMUSCULAR | Status: AC
  Filled 2018-12-08: qty 2

## 2018-12-08 MED ORDER — TETANUS-DIPHTH-ACELL PERTUSSIS 5-2.5-18.5 LF-MCG/0.5 IM SUSP: 0.5000 mL | Freq: Once | INTRAMUSCULAR | Status: DC

## 2018-12-08 MED ORDER — OXYCODONE-ACETAMINOPHEN 5-325 MG PO TABS: 1.0000 | ORAL_TABLET | ORAL | Status: DC | PRN

## 2018-12-08 MED ORDER — SENNOSIDES-DOCUSATE SODIUM 8.6-50 MG PO TABS
2.0000 | ORAL_TABLET | ORAL | Status: DC
  Administered 2018-12-08 – 2018-12-11 (×4): 2 via ORAL
  Filled 2018-12-08 (×4): qty 2

## 2018-12-08 MED ORDER — LIDOCAINE HCL (PF) 1 % IJ SOLN: 30.0000 mL | INTRAMUSCULAR | Status: DC | PRN

## 2018-12-08 MED ORDER — CEFOTETAN DISODIUM-DEXTROSE 2-2.08 GM-%(50ML) IV SOLR
INTRAVENOUS | Status: AC
  Filled 2018-12-08: qty 50

## 2018-12-08 MED ORDER — LACTATED RINGERS IV SOLN: 500.0000 mL | INTRAVENOUS | Status: DC | PRN

## 2018-12-08 MED ORDER — COCONUT OIL OIL: 1.0000 "application " | TOPICAL_OIL | Status: DC | PRN

## 2018-12-08 MED ORDER — SCOPOLAMINE 1 MG/3DAYS TD PT72
1.0000 | MEDICATED_PATCH | Freq: Once | TRANSDERMAL | Status: AC
  Administered 2018-12-08: 1.5 mg via TRANSDERMAL
  Filled 2018-12-08: qty 1

## 2018-12-08 MED ORDER — OXYTOCIN 40 UNITS IN NORMAL SALINE INFUSION - SIMPLE MED: 2.5000 [IU]/h | INTRAVENOUS | Status: DC

## 2018-12-08 MED ORDER — KETOROLAC TROMETHAMINE 30 MG/ML IJ SOLN
INTRAMUSCULAR | Status: AC
  Filled 2018-12-08: qty 1

## 2018-12-08 MED ORDER — TRAMADOL HCL 50 MG PO TABS
50.0000 mg | ORAL_TABLET | Freq: Four times a day (QID) | ORAL | Status: DC | PRN
  Administered 2018-12-09 – 2018-12-10 (×3): 50 mg via ORAL
  Filled 2018-12-08 (×3): qty 1

## 2018-12-08 MED ORDER — SODIUM CHLORIDE 0.9 % IV SOLN
INTRAVENOUS | Status: DC | PRN
  Administered 2018-12-08: 40 [IU] via INTRAVENOUS

## 2018-12-08 MED ORDER — SODIUM CHLORIDE 0.9 % IV SOLN
2.0000 g | Freq: Once | INTRAVENOUS | Status: AC
  Administered 2018-12-08: 2 g via INTRAVENOUS
  Filled 2018-12-08: qty 2

## 2018-12-08 MED ORDER — MENTHOL 3 MG MT LOZG: 1.0000 | LOZENGE | OROMUCOSAL | Status: DC | PRN

## 2018-12-08 SURGICAL SUPPLY — 28 items
CHLORAPREP W/TINT 26ML (MISCELLANEOUS) ×2 IMPLANT
CLAMP CORD UMBIL (MISCELLANEOUS) IMPLANT
CLOTH BEACON ORANGE TIMEOUT ST (SAFETY) ×2 IMPLANT
DRSG OPSITE POSTOP 4X10 (GAUZE/BANDAGES/DRESSINGS) ×2 IMPLANT
ELECT REM PT RETURN 9FT ADLT (ELECTROSURGICAL) ×2
ELECTRODE REM PT RTRN 9FT ADLT (ELECTROSURGICAL) ×1 IMPLANT
EXTRACTOR VACUUM M CUP 4 TUBE (SUCTIONS) IMPLANT
GLOVE BIOGEL PI IND STRL 7.0 (GLOVE) ×1 IMPLANT
GLOVE BIOGEL PI INDICATOR 7.0 (GLOVE) ×1
GLOVE SURG ORTHO 8.0 STRL STRW (GLOVE) ×2 IMPLANT
GOWN STRL REUS W/TWL LRG LVL3 (GOWN DISPOSABLE) ×4 IMPLANT
KIT ABG SYR 3ML LUER SLIP (SYRINGE) ×2 IMPLANT
NDL HYPO 25X5/8 SAFETYGLIDE (NEEDLE) ×1 IMPLANT
NEEDLE HYPO 25X5/8 SAFETYGLIDE (NEEDLE) ×2 IMPLANT
NS IRRIG 1000ML POUR BTL (IV SOLUTION) ×2 IMPLANT
PACK C SECTION WH (CUSTOM PROCEDURE TRAY) ×2 IMPLANT
PAD OB MATERNITY 4.3X12.25 (PERSONAL CARE ITEMS) ×2 IMPLANT
PENCIL SMOKE EVAC W/HOLSTER (ELECTROSURGICAL) ×2 IMPLANT
SUT MNCRL 0 VIOLET CTX 36 (SUTURE) ×3 IMPLANT
SUT MON AB 4-0 PS1 27 (SUTURE) ×2 IMPLANT
SUT MONOCRYL 0 CTX 36 (SUTURE) ×3
SUT PDS AB 1 CT  36 (SUTURE)
SUT PDS AB 1 CT 36 (SUTURE) IMPLANT
SUT VIC AB 1 CTX 36 (SUTURE)
SUT VIC AB 1 CTX36XBRD ANBCTRL (SUTURE) IMPLANT
TOWEL OR 17X24 6PK STRL BLUE (TOWEL DISPOSABLE) ×2 IMPLANT
TRAY FOLEY W/BAG SLVR 14FR LF (SET/KITS/TRAYS/PACK) ×2 IMPLANT
WATER STERILE IRR 1000ML POUR (IV SOLUTION) ×2 IMPLANT

## 2018-12-08 NOTE — MAU Note (Signed)
BS US performed to confirm presentation.  Vertex presentation confirmed by CNM

## 2018-12-08 NOTE — Anesthesia Postprocedure Evaluation (Signed)
Anesthesia Post Note  Patient: Kelly Crawford  Procedure(s) Performed: CESAREAN SECTION (N/A )     Patient location during evaluation: PACU Anesthesia Type: Spinal Level of consciousness: oriented and awake and alert Pain management: pain level controlled Vital Signs Assessment: post-procedure vital signs reviewed and stable Respiratory status: spontaneous breathing, respiratory function stable and patient connected to nasal cannula oxygen Cardiovascular status: blood pressure returned to baseline and stable Postop Assessment: no headache, no backache and no apparent nausea or vomiting Anesthetic complications: no    Last Vitals:  Vitals:   12/08/18 1543 12/08/18 1545  BP:  101/89  Pulse: (!) 55 (!) 56  Resp: 12 (!) 9  Temp:    SpO2:      Last Pain:  Vitals:   12/08/18 1543  TempSrc:   PainSc: 0-No pain   Pain Goal:                Epidural/Spinal Function Cutaneous sensation: Tingles (12/08/18 1543), Patient able to flex knees: No (12/08/18 1543), Patient able to lift hips off bed: No (12/08/18 1543), Back pain beyond tenderness at insertion site: No (12/08/18 1543), Progressively worsening motor and/or sensory loss: No (12/08/18 1543), Bowel and/or bladder incontinence post epidural: No (12/08/18 1543)  Chelsey L Woodrum

## 2018-12-08 NOTE — Op Note (Signed)
Cesarean Section Procedure Note  Pre-operative Diagnosis: IUP at term, SROM and early labor, Fetal intolerance to labor, Meconium  Post-operative Diagnosis: same  Surgeon: Luz Lex   Assistants: Tech  Anesthesia: spinal  Procedure:  Low Segment Transverse cesarean section  Procedure Details  The patient was seen in the Holding Room. The risks, benefits, complications, treatment options, and expected outcomes were discussed with the patient.  The patient concurred with the proposed plan, giving informed consent.  The site of surgery properly noted/marked.. A Time Out was held and the above information confirmed.  After induction of anesthesia, the patient was draped and prepped in the usual sterile manner. A Pfannenstiel incision was made and carried down through the subcutaneous tissue to the fascia. Fascial incision was made and extended transversely. The fascia was separated from the underlying rectus tissue superiorly and inferiorly. The peritoneum was identified and entered. Peritoneal incision was extended longitudinally. The utero-vesical peritoneal reflection was incised transversely and the bladder flap was bluntly freed from the lower uterine segment. A low transverse uterine incision was made. Delivered from vertex presentation was a baby with Apgar scores of 6 at one minute and 9 at five minutes. After the umbilical cord was clamped and cut cord blood was obtained for evaluation. The placenta was removed intact and appeared normal. The uterine outline, tubes and ovaries appeared normal. The uterine incision was closed with running locked sutures of 0 monocryl and imbricated with 0 monocryl. Hemostasis was observed. Lavage was carried out until clear. The peritoneum was then closed with 0 monocryl and rectus muscles plicated in the midline.  After hemostasis was assured, the fascia was then reapproximated with running sutures of 0 Vicryl. Irrigation was applied and after adequate  hemostasis was assured, the skin was reapproximated with subcutaneous sutures using 4-0 monocryl.  Instrument, sponge, and needle counts were correct prior the abdominal closure and at the conclusion of the case. The patient received 2 grams cefotetan preoperatively.  Findings: Viable female  Estimated Blood Loss:  100cc         Specimens: Placenta was sent to labor and delivery         Complications:  None

## 2018-12-08 NOTE — MAU Note (Signed)
Pt reports gush of fluid around 0800 today.  Pt reports some mild cramping but cannot rate on a numerical scale.  Pt reports "less movement this morning" but states she has felt the baby move a few times. FHR 150 during triage.

## 2018-12-08 NOTE — Progress Notes (Signed)
Vtx confirmed by US 

## 2018-12-08 NOTE — Transfer of Care (Signed)
Immediate Anesthesia Transfer of Care Note  Patient: Kelly Crawford  Procedure(s) Performed: CESAREAN SECTION (N/A )  Patient Location: PACU  Anesthesia Type:Spinal  Level of Consciousness: awake and alert   Airway & Oxygen Therapy: Patient Spontanous Breathing  Post-op Assessment: Report given to RN  Post vital signs: Reviewed  Last Vitals:  Vitals Value Taken Time  BP 100/62 12/08/18 1505  Temp    Pulse 48 12/08/18 1509  Resp 15 12/08/18 1509  SpO2 98 % 12/08/18 1509  Vitals shown include unvalidated device data.  Last Pain:  Vitals:   12/08/18 1140  TempSrc: Oral         Complications: No apparent anesthesia complications

## 2018-12-08 NOTE — Addendum Note (Signed)
Addendum  created 12/08/18 1716 by Freddrick March, MD   Clinical Note Signed

## 2018-12-08 NOTE — Anesthesia Preprocedure Evaluation (Addendum)
Anesthesia Evaluation  Patient identified by MRN, date of birth, ID band Patient awake    Reviewed: Allergy & Precautions, NPO status , Patient's Chart, lab work & pertinent test results  Airway Mallampati: II  TM Distance: >3 FB Neck ROM: Full    Dental no notable dental hx.    Pulmonary neg pulmonary ROS,    Pulmonary exam normal breath sounds clear to auscultation       Cardiovascular negative cardio ROS Normal cardiovascular exam Rhythm:Regular Rate:Normal     Neuro/Psych negative neurological ROS  negative psych ROS   GI/Hepatic negative GI ROS, Neg liver ROS,   Endo/Other  negative endocrine ROS  Renal/GU negative Renal ROS  negative genitourinary   Musculoskeletal negative musculoskeletal ROS (+)   Abdominal   Peds  Hematology negative hematology ROS (+)   Anesthesia Other Findings C/S 2/2 fetal intolerance of labor  Reproductive/Obstetrics (+) Pregnancy                             Anesthesia Physical Anesthesia Plan  ASA: II and emergent  Anesthesia Plan: Spinal   Post-op Pain Management:    Induction:   PONV Risk Score and Plan: Treatment may vary due to age or medical condition  Airway Management Planned: Natural Airway  Additional Equipment:   Intra-op Plan:   Post-operative Plan:   Informed Consent: I have reviewed the patients History and Physical, chart, labs and discussed the procedure including the risks, benefits and alternatives for the proposed anesthesia with the patient or authorized representative who has indicated his/her understanding and acceptance.     Dental advisory given  Plan Discussed with: CRNA  Anesthesia Plan Comments:        Anesthesia Quick Evaluation

## 2018-12-08 NOTE — Anesthesia Procedure Notes (Signed)
Spinal  Patient location during procedure: OR Start time: 12/08/2018 2:01 PM End time: 12/08/2018 2:11 PM Staffing Anesthesiologist: Freddrick March, MD Performed: anesthesiologist  Preanesthetic Checklist Completed: patient identified, surgical consent, pre-op evaluation, timeout performed, IV checked, risks and benefits discussed and monitors and equipment checked Spinal Block Patient position: sitting Prep: site prepped and draped and DuraPrep Patient monitoring: cardiac monitor, continuous pulse ox and blood pressure Approach: midline Location: L3-4 Injection technique: single-shot Needle Needle type: Pencan  Needle gauge: 24 G Needle length: 9 cm Assessment Sensory level: T6 Additional Notes Functioning IV was confirmed and monitors were applied. Sterile prep and drape, including hand hygiene and sterile gloves were used. The patient was positioned and the spine was prepped. The skin was anesthetized with lidocaine.  Free flow of clear CSF was obtained prior to injecting local anesthetic into the CSF.  The spinal needle aspirated freely following injection.  The needle was carefully withdrawn.  The patient tolerated the procedure well.

## 2018-12-08 NOTE — H&P (Signed)
Kelly Crawford is a 28 y.o. female presenting for SROM and early labor sxs.  Confirmed SROM in MAU and 1/2cm/th/high.  GBS neg and pregnancy uncomplicated.  Once in L&D noted to have decreased variability and recurrent late decels.  Was given IVF and position change without improvement.. OB History    Gravida  1   Para      Term      Preterm      AB      Living        SAB      TAB      Ectopic      Multiple      Live Births             Past Medical History:  Diagnosis Date  . Allergy    bee venom, pollen, dust, mold and mildew  . Pregnancy    due 12/29/2018 1st child   Past Surgical History:  Procedure Laterality Date  . ADENOIDECTOMY    . tubes in ears    . WISDOM TOOTH EXTRACTION     Family History: family history includes Cancer in her maternal grandfather and mother; Diabetes in her maternal grandfather and maternal grandmother; Hyperlipidemia in her maternal grandfather and maternal grandmother; Hypertension in her maternal grandfather and maternal grandmother. Social History:  reports that she has never smoked. She has never used smokeless tobacco. She reports previous alcohol use. She reports previous drug use.     Maternal Diabetes: No Genetic Screening: Normal Maternal Ultrasounds/Referrals: Normal Fetal Ultrasounds or other Referrals:  None Maternal Substance Abuse:  No Significant Maternal Medications:  None Significant Maternal Lab Results:  Group B Strep negative Other Comments:  None  ROS History Dilation: Fingertip Effacement (%): Thick Exam by:: Fredda Hammed RN  Blood pressure 136/89, pulse 88, temperature 98 F (36.7 C), temperature source Oral, resp. rate 20, height 5' (1.524 m), weight 85 kg, SpO2 96 %. Exam Physical Exam  Prenatal labs: ABO, Rh: --/--/PENDING (07/26 1231) Antibody: NEG (07/26 1231) Rubella:   RPR:    HBsAg:    HIV: normal (05/22 0000)  GBS: Negative (07/24 0000)   Assessment/Plan: IUP at term AROM  and early labor Fetal intolerance to labor.  Recommend Primary C/S.  Pt reports she can take amoxil and keflex but got nauseated with augmentin as a child. Risks and benefits of C/S were discussed.  All questions were answered and informed consent was obtained.  Plan to proceed with low segment transverse Cesarean Section.   Luz Lex 12/08/2018, 1:44 PM

## 2018-12-09 LAB — CBC
HCT: 36.3 % (ref 36.0–46.0)
Hemoglobin: 12.4 g/dL (ref 12.0–15.0)
MCH: 33.1 pg (ref 26.0–34.0)
MCHC: 34.2 g/dL (ref 30.0–36.0)
MCV: 96.8 fL (ref 80.0–100.0)
Platelets: 144 10*3/uL — ABNORMAL LOW (ref 150–400)
RBC: 3.75 MIL/uL — ABNORMAL LOW (ref 3.87–5.11)
RDW: 13.2 % (ref 11.5–15.5)
WBC: 12.7 10*3/uL — ABNORMAL HIGH (ref 4.0–10.5)
nRBC: 0 % (ref 0.0–0.2)

## 2018-12-09 LAB — ABO/RH: ABO/RH(D): O POS

## 2018-12-09 LAB — RPR: RPR Ser Ql: NONREACTIVE

## 2018-12-09 NOTE — Lactation Note (Signed)
This note was copied from a baby's chart. Lactation Consultation Note Baby 49 hrs old. Has had 3 low glucose serum levels. Mom has small round breast. Full feeling. Generalized edema noted. Edema to breast. Small nipples, short shaft, semi flat when compressed.  Reverse pressure attempted w/little help. Placed baby STS d/t low glucose. Fitted mom w/#20 NS. In football position baby to breast. Baby latched. Attempted to BF baby suckled a few times then started crying. Mom cont. To hold STS. Noted baby passing gas stopped crying. Hand expression taught to mom. No colostrum noted. Mom shown how to use DEBP & how to disassemble, clean, & reassemble parts. Mom knows to pump q3h for 15-20 min. Mom encouraged to waken baby for feeds if hasn't cued in 3 hrs.  LPI information sheet given. Highlighted some information, such as feeding time limits, and supplementing. Mom stated she will read the information sheet. 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 LC went to get information about the DEBP for Employees, when [facility] returned, the Dr. Rockey Situ mom that baby needed to be transferred to NICU, Mom upset crying. LC left pump list at bedside. LC will f/u later.  Patient Name: Kelly Crawford [id number] Date: 12/09/2018 Reason for consult: Initial assessment;Primapara;Infant < 6lbs;Early term 37-38.6wks;NICU baby   Maternal Data Has patient been taught Hand Expression?: Yes Does the patient have breastfeeding experience prior to this delivery?: No  Feeding Feeding Type: Bottle Fed - Formula Nipple Type: Slow - flow  LATCH Score Latch: Too sleepy or reluctant, no latch achieved, no sucking elicited.  Audible Swallowing: None  Type of Nipple: Everted at rest and after stimulation(very short shaft/semi flat/non compressible)  Comfort (Breast/Nipple): Filling, red/small blisters or bruises, mild/mod discomfort(edema/full feeling breast)  Hold (Positioning): Full assist, staff holds infant at  breast  LATCH Score: 3  Interventions Interventions: Breast feeding basics reviewed;Adjust position;DEBP;Assisted with latch;Support pillows;Skin to skin;Breast massage;Hand express;Pre-pump if needed;Shells;Reverse pressure;Breast compression  Lactation Tools Discussed/Used Tools: Shells;Pump;Flanges;Nipple Shields Nipple shield size: 20 Flange Size: 21 Shell Type: Inverted Breast pump type: Double-Electric Breast Pump WIC Program: No Pump Review: Setup, frequency, and cleaning;Milk Storage Initiated by:: Allayne Stack RN IBCLC Date initiated:: 12/09/18   Consult Status Consult Status: Follow-up Date: 12/09/18 Follow-up type: In-patient    Theodoro Kalata 12/09/2018, 6:21 AM

## 2018-12-09 NOTE — Progress Notes (Addendum)
Subjective: Postpartum Day 1: Cesarean Delivery Patient reports tolerating PO and no problems voiding.  Baby in NICU; rounded on patient at newborn's bedside.  Objective: Vital signs in last 24 hours: Temp:  [97.5 F (36.4 C)-98.7 F (37.1 C)] 98.2 F (36.8 C) (07/27 0700) Pulse Rate:  [46-88] 60 (07/27 0700) Resp:  [9-20] 18 (07/27 0700) BP: (100-137)/(59-89) 106/68 (07/27 0700) SpO2:  [96 %-99 %] 98 % (07/27 0700) Weight:  [85 kg] 85 kg (07/26 1322)  Physical Exam:  General: alert, cooperative and appears stated age Lochia: appropriate Uterine Fundus: firm Incision: pressure dressing removed; honeycomb saturated with old blood DVT Evaluation: No evidence of DVT seen on physical exam. Negative Homan's sign. No cords or calf tenderness.  Recent Labs    12/08/18 1234 12/09/18 0328  HGB 14.1 12.4  HCT 41.5 36.3    Assessment/Plan: Status post Cesarean section. Doing well postoperatively.  Continue current care. Replace honeycomb dressing.  Linda Hedges 12/09/2018, 9:33 AM

## 2018-12-09 NOTE — Lactation Note (Signed)
This note was copied from a baby's chart. Lactation Consultation Note  Patient Name: Kelly Crawford [id number] Date: 12/09/2018  Baby 23 hours in the NICU for low glucoses.  Mom has initiated pumping.  Instructed to pump and hand express every 3 hours to establish a good milk supply.  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  Providing Breastmilk For Your Baby in NICU  Book given.  Encouraged to call for assist/concerns.   Maternal Data    Feeding Feeding Type: Formula  LATCH Score                   Interventions    Lactation Tools Discussed/Used     Consult Status      Ave Filter 12/09/2018, 2:07 PM

## 2018-12-09 NOTE — Progress Notes (Signed)
Patient screened out for psychosocial assessment since none of the following apply: °Psychosocial stressors documented in mother or baby's chart °Gestation less than 32 weeks °Code at delivery  °Infant with anomalies °Please contact the Clinical Social Worker if specific needs arise, by MOB's request, or if MOB scores greater than 9/yes to question 10 on [place] Postpartum Depression Screen. ° °Torres Hardenbrook Boyd-Gilyard, MSW, LCSW °Clinical Social Work °[phone] °  °

## 2018-12-10 MED ORDER — HYDROCORTISONE 1 % EX CREA
TOPICAL_CREAM | Freq: Three times a day (TID) | CUTANEOUS | Status: DC | PRN
  Administered 2018-12-10: 23:00:00 via TOPICAL
  Filled 2018-12-10 (×2): qty 28

## 2018-12-10 NOTE — Progress Notes (Signed)
Subjective: Postpartum Day 2: Cesarean Delivery Patient reports incisional pain, tolerating PO, + flatus and no problems voiding.   Baby in NICU with hypoglycemia Objective: Vital signs in last 24 hours: Temp:  [97.4 F (36.3 C)-98.7 F (37.1 C)] 97.4 F (36.3 C) (07/28 0439) Pulse Rate:  [52-63] 53 (07/28 0439) Resp:  [16-18] 18 (07/28 0439) BP: (94-115)/(60-76) 107/66 (07/28 0439) SpO2:  [98 %] 98 % (07/27 2136)  Physical Exam:  General: alert, cooperative and no distress Lochia: appropriate Uterine Fundus: firm Incision: healing well DVT Evaluation: No evidence of DVT seen on physical exam. Mild rash around right end of honeycomb Recent Labs    12/08/18 1234 12/09/18 0328  HGB 14.1 12.4  HCT 41.5 36.3    Assessment/Plan: Status post Cesarean section. Doing well postoperatively.  Continue current care Hydrocortisone cream prn to rash.  Shon Millet II 12/10/2018, 7:10 AM

## 2018-12-10 NOTE — Lactation Note (Addendum)
This note was copied from a baby's chart. Lactation Consultation Note  baby 16 hrs old, still in NICU. Mom is using DEBP as directed. Not getting any colostrum excepts for dots to nipples. LC changed flanges to #24. Mom's nipples have changed from this morning since has been pumping. Noted less edema in breast, breast tissue slightly softer.  Mom forgot to wear shells yesterday. States will wear them today. Noted a few drops of very thick like glue. Put in colostrum container and placed in mom's ref. For FOB to take up on next visit. Instructed mom each pumping hand express afterwards for more colostrum. Each time use separate colostrum container. Milk storage for NICU reviewed.  Mom's spirits better.  Mom is [employer]. Insurance card copied for DEBP. Mom chose Back pack. Gave to mom. Will be instructed on day of d/c home.  Mom would like LC to assess baby for lip and tongue tie before baby is d/c home. Mom's sister's baby had issues with tongue tie.  Encouraged mom to call for questions. Encouraged mom to rest between visits and pumping.  Patient Name: Kelly Crawford [id number] Date: 12/10/2018 Reason for consult: Follow-up assessment;NICU baby;Primapara;Infant < 6lbs;Early term 37-38.6wks   Maternal Data Has patient been taught Hand Expression?: Yes Does the patient have breastfeeding experience prior to this delivery?: No  Feeding Feeding Type: Formula Nipple Type: Nfant Slow Flow (purple)  LATCH Score       Type of Nipple: Everted at rest and after stimulation(short shaft)  Comfort (Breast/Nipple): Filling, red/small blisters or bruises, mild/mod discomfort(edema to breast)        Interventions Interventions: DEBP;Breast compression;Hand express  Lactation Tools Discussed/Used Flange Size: 24 Breast pump type: Double-Electric Breast Pump WIC Program: No Pump Review: Milk Storage;Setup, frequency, and cleaning   Consult Status Consult Status:  Follow-up Date: 12/11/18 Follow-up type: In-patient    Theodoro Kalata 12/10/2018, 4:42 AM

## 2018-12-10 NOTE — Progress Notes (Signed)
Called OB/GYN clinic and spoke with the nurse that works with patient's primary MD to verify patient's rubella status.  Nurse states patient is rubella immune.  Ronney Lion Shayde Gervacio Therapist, sports

## 2018-12-11 NOTE — Progress Notes (Signed)
POD # 3  Having a lot of incisional pain - gas pain, burning.  BP 120/75 (BP Location: Left Arm)   Pulse (!) 59   Temp 98.4 F (36.9 C) (Oral)   Resp 18   Ht 5' (1.524 m)   Wt 85 kg   SpO2 99%   Breastfeeding Unknown   BMI 36.62 kg/m  No results found for this or any previous visit (from the past 24 hour(s)). Abdomen is soft and non tender Bandage is clean and dry and intact  POD # 3 C Section Postop pain  Discharge tomorrow Work on pain control Encourage more ambulation Baby in NICU secondary to hypoglycemia - patient emotional about that

## 2018-12-11 NOTE — Lactation Note (Signed)
This note was copied from a baby's chart. Lactation Consultation Note  Patient Name: Kelly Crawford [id number] Date: 12/11/2018 Reason for consult: Follow-up assessment;Early term 37-38.6wks;Primapara;1st time breastfeeding;NICU baby;Infant < 6lbs  P1 mother whose infant is now 8 hours.  This is an ETI at 37+0 weeks weighing < 6 lbs and in the NICU.    Mother had just finished pumping when I arrived.  She is pumping every three hours and just started obtaining EBM as of last night.  She currently has approximately 2-3 mls at bedside which she will label and take to the NICU.  Education provided regarding breast feeding her ETI, pumping, hand expression, breast/nipple care and STS while in the NICU.  Both parents are very receptive to learning and interested.  Mother's stated that she may be allowed to stay another night to recover from her C-section and to be closer to her baby.    Mother will continue to pump and include hand expression before/after pumping to help increase milk supply.  Suggested she pump in the NICU when able to visualize her baby and potentially increase milk supply.  Mother's breasts are soft and non tender and nipples are short shafted and everted.  Mother has breast shells at bedside and I provided a manual pump with instructions to help evert nipples.  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  She will call for any further questions/concerns.  Father supportive.   Maternal Data Formula Feeding for Exclusion: No Has patient been taught Hand Expression?: Yes Does the patient have breastfeeding experience prior to this delivery?: No  Feeding    LATCH Score                   Interventions    Lactation Tools Discussed/Used Tools: Shells;Pump;Flanges Flange Size: 24;27 Shell Type: Inverted Breast pump type: Double-Electric Breast Pump WIC Program: No Pump Review: Setup, frequency, and cleaning;Milk  Storage Initiated by:: Kizer Nobbe Date initiated:: 12/11/18   Consult Status Consult Status: Follow-up Date: 12/12/18 Follow-up type: In-patient    Maizee Reinhold R Charnetta Wulff 12/11/2018, 9:11 AM

## 2018-12-12 MED ORDER — OXYCODONE-ACETAMINOPHEN 5-325 MG PO TABS: 1.0000 | ORAL_TABLET | Freq: Four times a day (QID) | ORAL | 0 refills | Status: DC | PRN

## 2018-12-12 MED ORDER — IBUPROFEN 600 MG PO TABS: 600.0000 mg | ORAL_TABLET | Freq: Four times a day (QID) | ORAL | 0 refills | Status: DC | PRN

## 2018-12-12 MED FILL — IBUPROFEN 600 MG TABLET: 600 | 10 days supply | Qty: 30 | Fill #0

## 2018-12-12 MED FILL — OXYCODONE-ACETAMINOPHEN 5-3: 5-325 | 3 days supply | Qty: 20 | Fill #0

## 2018-12-12 NOTE — Lactation Note (Signed)
This note was copied from a baby's chart. Lactation Consultation Note  Patient Name: Kelly Crawford [id number] Date: 12/12/2018   Mom has been pumping about q3 hrs. She last got 22 mL. She has gotten more than 20 mL/session the last 3-4 times. Mom's milk is coming to volume. She has a Medela Pump in Style at home. Mom pumped 25 mL during consult. Size 24 flanges are appropriate for her at this time. Turning up suction and removing flanges discussed with Mom.   Mom was shown how to assemble & use hand pump (single- & double-mode) that was included in pump kit. Sanitizing pump parts at least once/day discussed with parents.   Parents know how to reach [place] for post-discharge questions once they go home.    Matthias Hughs [facility] 12/12/2018, 9:27 AM

## 2018-12-12 NOTE — Progress Notes (Signed)
Pt discharged via wheelchair accompanied by her husband. All belongings and discharge paperwork sent with patient. All questions answered. Pt in stable condition at time of discharge.

## 2018-12-12 NOTE — Plan of Care (Signed)
  Problem: Education: Goal: Individualized Educational Video(s) Outcome: Adequate for Discharge Goal: Individualized Newborn Educational Video(s) Outcome: Adequate for Discharge   Problem: Coping: Goal: Ability to identify and utilize available resources and services will improve Outcome: Adequate for Discharge   Problem: Skin Integrity: Goal: Demonstration of wound healing without infection will improve Outcome: Adequate for Discharge   Problem: Education: Goal: Knowledge of General Education information will improve Description: Including pain rating scale, medication(s)/side effects and non-pharmacologic comfort measures Outcome: Adequate for Discharge   Problem: Health Behavior/Discharge Planning: Goal: Ability to manage health-related needs will improve Outcome: Adequate for Discharge   Problem: Clinical Measurements: Goal: Ability to maintain clinical measurements within normal limits will improve Outcome: Adequate for Discharge Goal: Will remain free from infection Outcome: Adequate for Discharge Goal: Respiratory complications will improve Outcome: Adequate for Discharge Goal: Cardiovascular complication will be avoided Outcome: Adequate for Discharge   Problem: Nutrition: Goal: Adequate nutrition will be maintained Outcome: Adequate for Discharge   Problem: Elimination: Goal: Will not experience complications related to bowel motility Outcome: Adequate for Discharge Goal: Will not experience complications related to urinary retention Outcome: Adequate for Discharge   Problem: Pain Managment: Goal: General experience of comfort will improve Outcome: Adequate for Discharge   Problem: Safety: Goal: Ability to remain free from injury will improve Outcome: Adequate for Discharge

## 2018-12-15 ENCOUNTER — Ambulatory Visit: Payer: Self-pay

## 2018-12-15 NOTE — Lactation Note (Signed)
This note was copied from a baby's chart. Lactation Consultation Note  Patient Name: Kelly Crawford [id number] Date: 12/15/2018 Reason for consult: Follow-up assessment;NICU baby;Early term 109-38.6wks  Baby is 64 days old.  Baby is receiving both bottle and gavage feedings.  He is improving with taking volume by bottle.  Baby has not been going to breast.  Mom is pumping every 3 hours and obtaining 60-90 mls.  Breasts are comfortable but nipples sore. Mom is not using coconut oil but will begin to. Mom just finished pumping.  Discussed waiting until after breastfeeding so the flow of milk and transfer is better.  Baby is currently sucking on her pacifier.  Discussed using cross cradle and football hold.  Baby placed in cross cradle on left breast.  Nipples intact and short shafted.  With good breast compression baby latched but had some difficulty sustaining latch.  When baby became fussy we positioned baby on right side in football hold.  Baby had difficulty with latch so a 20 mm nipple shield applied.  Baby sucked a few times and then very fussy.  Baby is most likely used to faster flow from bottle.  RN will call with feeding cues and LC will assist with feed prior to mom pumping breasts.  Maternal Data    Feeding Feeding Type: Breast Fed  LATCH Score Latch: Repeated attempts needed to sustain latch, nipple held in mouth throughout feeding, stimulation needed to elicit sucking reflex.  Audible Swallowing: None  Type of Nipple: Everted at rest and after stimulation(short shafted)  Comfort (Breast/Nipple): Filling, red/small blisters or bruises, mild/mod discomfort  Hold (Positioning): Assistance needed to correctly position infant at breast and maintain latch.  LATCH Score: 5  Interventions    Lactation Tools Discussed/Used Tools: Nipple Shields Nipple shield size: 20   Consult Status Consult Status: Follow-up Follow-up type: In-patient    Ave Filter 12/15/2018, 12:20  PM

## 2018-12-16 ENCOUNTER — Ambulatory Visit: Payer: Self-pay

## 2018-12-16 NOTE — Lactation Note (Signed)
This note was copied from a baby's chart. Lactation Consultation Note  Patient Name: Kelly Crawford [id number] Date: 12/16/2018 Reason for consult: Early term 37-38.6wks;Primapara;1st time breastfeeding;NICU baby  P51 mother whose infant is now 58 days old and in the NICU.  This is an ETI at 37+0 weeks.  Parents will be discharged home today.  Mother had asked for lactation assistance.  This is the first time her baby will attempt to breast feed.    Mother's breasts are full and nipples are short shafted and intact.  Mother has been pumping every three hours and obtaining approximately 40 mls of milk with each pumping.  Offered to assist with latching and mother accepted.  Attempted to latch in the cross cradle hold on the left breast but baby was not interested.  Mother expressed drops and baby was finger fed the expressed breast milk.  Again, attempted to latch but she became fussy and pushed back off of breast.  Burped baby without success and tried again with the same results.  Suggested mother pump for a few minutes to relieve fullness while I fed some EBM to help calm baby.  Mother agreeable.  After softening the breasts I attempted to latch baby onto the left breast in the football hold but she was not interested.  Mother had a NS at bedside and baby latched but would not suck.  She became very agitated.  Reassured mother that this was a new experience for baby and it will take practice to latch and feed.  Discussed tips to help with latching and supplementing at home.    RN into room to bottle feed using mother's EBM while mother finished pumping.  Baby calm and taking the bottle without difficulty.  Mother and father had many questions which I answered to their satisfaction.  Suggested mother make an appointment with the OP [facility] for follow up.  Father plans to call the insurance company tomorrow to determine eligibility.  Mother will probably come in for an OP visit.  She has our OP phone  number for any questions/concerns after discharge.  Father very supportive.  Both parents receptive to learning.   Maternal Data    Feeding Feeding Type: Bottle Fed - Breast Milk Nipple Type: Nfant Slow Flow (purple)  LATCH Score                   Interventions    Lactation Tools Discussed/Used     Consult Status Consult Status: Complete Date: 12/16/18 Follow-up type: Call as needed    Lanice Schwab Louine Tenpenny 12/16/2018, 4:13 PM

## 2019-01-02 NOTE — Discharge Summary (Signed)
Obstetric Discharge Summary Reason for Admission: [facility] Prenatal Procedures: ultrasound Intrapartum Procedures: cesarean: low cervical, transverse Postpartum Procedures: none Complications-Operative and Postpartum: none Hemoglobin  Date Value Ref Range Status  12/09/2018 12.4 12.0 - 15.0 g/dL Final   HCT  Date Value Ref Range Status  12/09/2018 36.3 36.0 - 46.0 % Final    Physical Exam:  General: alert and cooperative Lochia: appropriate Uterine Fundus: firm Incision: healing well, no significant drainage DVT Evaluation: No evidence of DVT seen on physical exam.  Discharge Diagnoses: Term Pregnancy-delivered  Discharge Information: Date: 01/02/2019 Activity: pelvic rest Diet: routine Medications: PNV and Ibuprofen Condition: stable Instructions: refer to practice specific booklet Discharge to: home   Newborn Data: Live born female  Birth Weight: 5 lb 2.7 oz (2345 g) APGAR: 6, 9  Newborn Delivery   Birth date/time: 12/[birth year]/2018 14:25:00 Delivery type: C-Section, Vacuum Assisted Trial of labor: No C-section categorization: Primary      Home with mother.  Kelly Crawford 01/02/2019, 7:58 AM

## 2019-03-19 ENCOUNTER — Ambulatory Visit (INDEPENDENT_AMBULATORY_CARE_PROVIDER_SITE_OTHER): Payer: No Typology Code available for payment source | Admitting: Family Medicine

## 2019-03-19 ENCOUNTER — Encounter: Payer: Self-pay | Admitting: Family Medicine

## 2019-03-19 VITALS — Ht 60.0 in | Wt 160.0 lb

## 2019-03-19 DIAGNOSIS — J019 Acute sinusitis, unspecified: Secondary | ICD-10-CM

## 2019-03-19 DIAGNOSIS — J029 Acute pharyngitis, unspecified: Secondary | ICD-10-CM | POA: Diagnosis not present

## 2019-03-19 DIAGNOSIS — H9203 Otalgia, bilateral: Secondary | ICD-10-CM | POA: Diagnosis not present

## 2019-03-19 MED ORDER — CEPHALEXIN 500 MG PO CAPS: 500.0000 mg | ORAL_CAPSULE | Freq: Two times a day (BID) | ORAL | 0 refills | Status: DC

## 2019-03-19 MED ORDER — FLUTICASONE PROPIONATE 50 MCG/ACT NA SUSP: 2.0000 | Freq: Every day | NASAL | 2 refills | Status: DC

## 2019-03-19 NOTE — Progress Notes (Signed)
Patient ID: Kelly Crawford, female   DOB: July 08, 1990, 28 y.o.   MRN: [id number]    Virtual Visit via video Note  This visit type was conducted due to national recommendations for restrictions regarding the COVID-19 pandemic (e.g. social distancing).  This format is felt to be most appropriate for this patient at this time.  All issues noted in this document were discussed and addressed.  No physical exam was performed (except for noted visual exam findings with Video Visits).   I connected with Loretha Brasil today at 10:20 AM EST by a video enabled telemedicine application and verified that I am speaking with the correct person using two identifiers. Location patient: home Location provider: work or home office Persons participating in the virtual visit: patient, provider  I discussed the limitations, risks, security and privacy concerns of performing an evaluation and management service by video and the availability of in person appointments. I also discussed with the patient that there may be a patient responsible charge related to this service. The patient expressed understanding and agreed to proceed.   HPI:  Patient and I connected via video due to complaints of sinus congestion, sore throat and ear pain for little over a week.  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  Patient was tested and results are pending.  Patient is suspicious she may have sinus infection however due to ear pain and thick nasal congestion that is only seeming to get worse rather than better.  No fever or chills.  No cough, shortness of breath or wheezing.  No body aches.  Nasal discharge is green/yellow.  Does take Allegra every day.   ROS: See pertinent positives and negatives per HPI.  Past Medical History:  Diagnosis Date  . Allergy    bee venom, pollen, dust, mold and mildew  . Pregnancy    due 12/29/2018 1st child    Past Surgical History:   Procedure Laterality Date  . ADENOIDECTOMY    . CESAREAN SECTION N/A 12/08/2018   Procedure: CESAREAN SECTION;  Surgeon: Candice Camp, MD;  Location: [facility] LD ORS;  Service: Obstetrics;  Laterality: N/A;  . tubes in ears    . WISDOM TOOTH EXTRACTION      Family History  Problem Relation Age of Onset  . Cancer Mother        breast dx'ed age 10  . Hyperlipidemia Maternal Grandmother   . Hypertension Maternal Grandmother   . Diabetes Maternal Grandmother        pre  . Cancer Maternal Grandfather        prostate  . Hypertension Maternal Grandfather   . Hyperlipidemia Maternal Grandfather   . Diabetes Maternal Grandfather        pre   Social History   Tobacco Use  . Smoking status: Never Smoker  . Smokeless tobacco: Never Used  Substance Use Topics  . Alcohol use: Not Currently    Current Outpatient Medications:  .  norethindrone (MICRONOR) 0.35 MG tablet, Take 1 tablet by mouth daily., Disp: , Rfl:  .  Prenatal Vit-Fe Fumarate-FA (PRENATAL VITAMINS PO), Take 1 tablet by mouth., Disp: , Rfl:   EXAM:  GENERAL: alert, oriented, appears well and in no acute distress  HEENT: atraumatic, conjunttiva clear, no obvious abnormalities on inspection of external nose and ears  NECK: normal movements of the head and neck  LUNGS: on inspection no signs of respiratory distress, breathing rate appears normal, no obvious  gross SOB, gasping or wheezing  CV: no obvious cyanosis  MS: moves all visible extremities without noticeable abnormality  PSYCH/NEURO: pleasant and cooperative, no obvious depression or anxiety, speech and thought processing grossly intact  ASSESSMENT AND PLAN:  Discussed the following assessment and plan:  Sinusitis-suspect patient does have a sinus infection.  We will treat her with antibiotic and she has been encouraged to do Flonase nasal spray daily as well as saline nasal rinses as well as continue her Allegra.  COVID-19 results are pending.  If results are  negative, patient should be able to return to work while under treatment for sinusitis.  At this time patient is remaining under quarantine until COVID-19 testing results are back.   I discussed the assessment and treatment plan with the patient. The patient was provided an opportunity to ask questions and all were answered. The patient agreed with the plan and demonstrated an understanding of the instructions.   The patient was advised to call back or seek an in-person evaluation if the symptoms worsen or if the condition fails to improve as anticipate.  Verl Blalock, FNP

## 2019-06-27 ENCOUNTER — Encounter: Payer: Self-pay | Admitting: Internal Medicine

## 2019-06-27 ENCOUNTER — Other Ambulatory Visit: Payer: Self-pay

## 2019-06-27 ENCOUNTER — Ambulatory Visit (INDEPENDENT_AMBULATORY_CARE_PROVIDER_SITE_OTHER): Payer: No Typology Code available for payment source | Admitting: Internal Medicine

## 2019-06-27 VITALS — Ht 60.0 in | Wt 157.0 lb

## 2019-06-27 DIAGNOSIS — E559 Vitamin D deficiency, unspecified: Secondary | ICD-10-CM

## 2019-06-27 DIAGNOSIS — Z0184 Encounter for antibody response examination: Secondary | ICD-10-CM

## 2019-06-27 DIAGNOSIS — E785 Hyperlipidemia, unspecified: Secondary | ICD-10-CM

## 2019-06-27 DIAGNOSIS — D72829 Elevated white blood cell count, unspecified: Secondary | ICD-10-CM | POA: Diagnosis not present

## 2019-06-27 DIAGNOSIS — Z1159 Encounter for screening for other viral diseases: Secondary | ICD-10-CM

## 2019-06-27 NOTE — Progress Notes (Signed)
Telephone Note  I connected with Kelly Crawford   on 06/27/19 at  9:015 AM EST by telephone and verified that I am speaking with the correct person using two identifiers.  Location patient: home Location provider:work or home office Persons participating in the virtual visit: patient, provider  I discussed the limitations of evaluation and management by telemedicine and the availability of in person appointments. The patient expressed understanding and agreed to proceed.   HPI: 1 HLD FH paternal side with HLD Will repeat labs for bmet, cbc with leukocytosis, iron and vitamin D   ROS: See pertinent positives and negatives per HPI.  Past Medical History:  Diagnosis Date  . Allergy    bee venom, pollen, dust, mold and mildew  . Pregnancy    due 12/29/2018 1st child    Past Surgical History:  Procedure Laterality Date  . ADENOIDECTOMY    . CESAREAN SECTION N/A 12/08/2018   Procedure: CESAREAN SECTION;  Surgeon: Louretta Shorten, MD;  Location: [facility] LD ORS;  Service: Obstetrics;  Laterality: N/A;  . tubes in ears    . WISDOM TOOTH EXTRACTION      Family History  Problem Relation Age of Onset  . Cancer Mother        breast dx'ed age 39  . Hyperlipidemia Maternal Grandmother   . Hypertension Maternal Grandmother   . Diabetes Maternal Grandmother        pre  . Cancer Maternal Grandfather        prostate  . Hypertension Maternal Grandfather   . Hyperlipidemia Maternal Grandfather   . Diabetes Maternal Grandfather        pre  . Hyperlipidemia Other     SOCIAL HX:   Married  Expected baby girl 12/29/2018 [place] [employer] in [place] de[employer]  Likes spending time with family and shopping  From [place] [phone]  Current Outpatient Medications:  .  cephALEXin (KEFLEX) 500 MG capsule, Take 1 capsule (500 mg total) by mouth 2 (two) times daily., Disp: 20 capsule, Rfl: 0 .  fluticasone (FLONASE) 50 MCG/ACT nasal spray, Place 2 sprays into both nostrils daily.,  Disp: 16 g, Rfl: 2 .  norethindrone (MICRONOR) 0.35 MG tablet, Take 1 tablet by mouth daily., Disp: , Rfl:  .  Prenatal Vit-Fe Fumarate-FA (PRENATAL VITAMINS PO), Take 1 tablet by mouth., Disp: , Rfl:   EXAM:  VITALS per patient if applicable:  GENERAL: alert, oriented, appears well and in no acute distress  PSYCH/NEURO: pleasant and cooperative, no obvious depression or anxiety, speech and thought processing grossly intact  ASSESSMENT AND PLAN:  Discussed the following assessment and plan:  Hyperlipidemia, unspecified hyperlipidemia type - Plan: Lipid panel, Basic Metabolic Panel (BMET) Given diet info   Iron overload - Plan: Iron and TIBC([facility]/Sunquest), Ferritin, Basic Metabolic Panel (BMET)  Vitamin D deficiency - Plan: Vitamin D (25 hydroxy)  Leukocytosis, unspecified type - Plan: CBC with Differential/Platelet  HM rec healthy diet and exercise  Flu shot utd  Tdap had 10/04/18  covid 19 vx will wait breastfeeding  Check hep B status Consider MMR in future   Pap per pt had 06/2018 need to copy copy requested today from ob/gyn  -pap 06/07/18 negative no HPV testing done scanned into chart    -HIV neg 10/04/18, HCV neg 05/30/18, HBsAg negative  CBC 8.3 13.0/38.6/190  Blood type O+ Rubella abs 3.69 + [redacted] weeks gestation of pregnancy - Plan: f/u ob/gyn Dr. Gaetano Net   -we discussed possible serious and likely  etiologies, options for evaluation and workup, limitations of telemedicine visit vs in person visit, treatment, treatment risks and precautions. Pt prefers to treat via telemedicine empirically rather then risking or undertaking an in person visit at this moment. Patient agrees to seek prompt in person care if worsening, new symptoms arise, or if is not improving with treatment.   I discussed the assessment and treatment plan with the patient. The patient was provided an opportunity to ask questions and all were answered. The patient agreed with the plan and  demonstrated an understanding of the instructions.   The patient was advised to call back or seek an in-person evaluation if the symptoms worsen or if the condition fails to improve as anticipated.  Time spent 15 minutes  Delorise Jackson, MD

## 2019-06-27 NOTE — Patient Instructions (Signed)
High Cholesterol  High cholesterol is a condition in which the blood has high levels of a white, waxy, fat-like substance (cholesterol). The human body needs small amounts of cholesterol. The liver makes all the cholesterol that the body needs. Extra (excess) cholesterol comes from the food that we eat. Cholesterol is carried from the liver by the blood through the blood vessels. If you have high cholesterol, deposits (plaques) may build up on the walls of your blood vessels (arteries). Plaques make the arteries narrower and stiffer. Cholesterol plaques increase your risk for heart attack and stroke. Work with your health care provider to keep your cholesterol levels in a healthy range. What increases the risk? This condition is more likely to develop in people who:  Eat foods that are high in animal fat (saturated fat) or cholesterol.  Are overweight.  Are not getting enough exercise.  Have a family history of high cholesterol. What are the signs or symptoms? There are no symptoms of this condition. How is this diagnosed? This condition may be diagnosed from the results of a blood test.  If you are older than age 20, your health care provider may check your cholesterol every 4-6 years.  You may be checked more often if you already have high cholesterol or other risk factors for heart disease. The blood test for cholesterol measures:  "Bad" cholesterol (LDL cholesterol). This is the main type of cholesterol that causes heart disease. The desired level for LDL is less than 100.  "Good" cholesterol (HDL cholesterol). This type helps to protect against heart disease by cleaning the arteries and carrying the LDL away. The desired level for HDL is 60 or higher.  Triglycerides. These are fats that the body can store or burn for energy. The desired number for triglycerides is lower than 150.  Total cholesterol. This is a measure of the total amount of cholesterol in your blood, including LDL  cholesterol, HDL cholesterol, and triglycerides. A healthy number is less than 200. How is this treated? This condition is treated with diet changes, lifestyle changes, and medicines. Diet changes  This may include eating more whole grains, fruits, vegetables, nuts, and fish.  This may also include cutting back on red meat and foods that have a lot of added sugar. Lifestyle changes  Changes may include getting at least 40 minutes of aerobic exercise 3 times a week. Aerobic exercises include walking, biking, and swimming. Aerobic exercise along with a healthy diet can help you maintain a healthy weight.  Changes may also include quitting smoking. Medicines  Medicines are usually given if diet and lifestyle changes have failed to reduce your cholesterol to healthy levels.  Your health care provider may prescribe a statin medicine. Statin medicines have been shown to reduce cholesterol, which can reduce the risk of heart disease. Follow these instructions at home: Eating and drinking If told by your health care provider:  Eat chicken (without skin), fish, veal, shellfish, ground turkey breast, and round or loin cuts of red meat.  Do not eat fried foods or fatty meats, such as hot dogs and salami.  Eat plenty of fruits, such as apples.  Eat plenty of vegetables, such as broccoli, potatoes, and carrots.  Eat beans, peas, and lentils.  Eat grains such as barley, rice, couscous, and bulgur wheat.  Eat pasta without cream sauces.  Use skim or nonfat milk, and eat low-fat or nonfat yogurt and cheeses.  Do not eat or drink whole milk, cream, ice cream, egg yolks,   or hard cheeses.  Do not eat stick margarine or tub margarines that contain trans fats (also called partially hydrogenated oils).  Do not eat saturated tropical oils, such as coconut oil and palm oil.  Do not eat cakes, cookies, crackers, or other baked goods that contain trans fats.  General instructions  Exercise as  directed by your health care provider. Increase your activity level with activities such as gardening, walking, and taking the stairs.  Take over-the-counter and prescription medicines only as told by your health care provider.  Do not use any products that contain nicotine or tobacco, such as cigarettes and e-cigarettes. If you need help quitting, ask your health care provider.  Keep all follow-up visits as told by your health care provider. This is important. Contact a health care provider if:  You are struggling to maintain a healthy diet or weight.  You need help to start on an exercise program.  You need help to stop smoking. Get help right away if:  You have chest pain.  You have trouble breathing. This information is not intended to replace advice given to you by your health care provider. Make sure you discuss any questions you have with your health care provider. Document Revised: 05/04/2017 Document Reviewed: 10/30/2015 Elsevier Patient Education  2020 Elsevier Inc.  Cholesterol Content in Foods Cholesterol is a waxy, fat-like substance that helps to carry fat in the blood. The body needs cholesterol in small amounts, but too much cholesterol can cause damage to the arteries and heart. Most people should eat less than 200 milligrams (mg) of cholesterol a day. Foods with cholesterol  Cholesterol is found in animal-based foods, such as meat, seafood, and dairy. Generally, low-fat dairy and lean meats have less cholesterol than full-fat dairy and fatty meats. The milligrams of cholesterol per serving (mg per serving) of common cholesterol-containing foods are listed below. Meat and other proteins  Egg -- one large whole egg has 186 mg.  Veal shank -- 4 oz has 141 mg.  Lean ground turkey (93% lean) -- 4 oz has 118 mg.  Fat-trimmed lamb loin -- 4 oz has 106 mg.  Lean ground beef (90% lean) -- 4 oz has 100 mg.  Lobster -- 3.5 oz has 90 mg.  Pork loin chops -- 4 oz has  86 mg.  Canned salmon -- 3.5 oz has 83 mg.  Fat-trimmed beef top loin -- 4 oz has 78 mg.  Frankfurter -- 1 frank (3.5 oz) has 77 mg.  Crab -- 3.5 oz has 71 mg.  Roasted chicken without skin, white meat -- 4 oz has 66 mg.  Light bologna -- 2 oz has 45 mg.  Deli-cut turkey -- 2 oz has 31 mg.  Canned tuna -- 3.5 oz has 31 mg.  Bacon -- 1 oz has 29 mg.  Oysters and mussels (raw) -- 3.5 oz has 25 mg.  Mackerel -- 1 oz has 22 mg.  Trout -- 1 oz has 20 mg.  Pork sausage -- 1 link (1 oz) has 17 mg.  Salmon -- 1 oz has 16 mg.  Tilapia -- 1 oz has 14 mg. Dairy  Soft-serve ice cream --  cup (4 oz) has 103 mg.  Whole-milk yogurt -- 1 cup (8 oz) has 29 mg.  Cheddar cheese -- 1 oz has 28 mg.  American cheese -- 1 oz has 28 mg.  Whole milk -- 1 cup (8 oz) has 23 mg.  2% milk -- 1 cup (8 oz) has 18   mg.  Cream cheese -- 1 tablespoon (Tbsp) has 15 mg.  Cottage cheese --  cup (4 oz) has 14 mg.  Low-fat (1%) milk -- 1 cup (8 oz) has 10 mg.  Sour cream -- 1 Tbsp has 8.5 mg.  Low-fat yogurt -- 1 cup (8 oz) has 8 mg.  Nonfat Greek yogurt -- 1 cup (8 oz) has 7 mg.  Half-and-half cream -- 1 Tbsp has 5 mg. Fats and oils  Cod liver oil -- 1 tablespoon (Tbsp) has 82 mg.  Butter -- 1 Tbsp has 15 mg.  Lard -- 1 Tbsp has 14 mg.  Bacon grease -- 1 Tbsp has 14 mg.  Mayonnaise -- 1 Tbsp has 5-10 mg.  Margarine -- 1 Tbsp has 3-10 mg. Exact amounts of cholesterol in these foods may vary depending on specific ingredients and brands. Foods without cholesterol Most plant-based foods do not have cholesterol unless you combine them with a food that has cholesterol. Foods without cholesterol include:  Grains and cereals.  Vegetables.  Fruits.  Vegetable oils, such as olive, canola, and sunflower oil.  Legumes, such as peas, beans, and lentils.  Nuts and seeds.  Egg whites. Summary  The body needs cholesterol in small amounts, but too much cholesterol can cause damage  to the arteries and heart.  Most people should eat less than 200 milligrams (mg) of cholesterol a day. This information is not intended to replace advice given to you by your health care provider. Make sure you discuss any questions you have with your health care provider. Document Revised: 04/13/2017 Document Reviewed: 12/26/2016 Elsevier Patient Education  2020 Elsevier Inc.  

## 2019-07-07 ENCOUNTER — Other Ambulatory Visit: Payer: Self-pay

## 2019-07-07 ENCOUNTER — Other Ambulatory Visit
Admission: RE | Admit: 2019-07-07 | Discharge: 2019-07-07 | Disposition: A | Discharge status: Home / Self Care | Payer: No Typology Code available for payment source | Source: Ambulatory Visit | Attending: Internal Medicine | Admitting: Internal Medicine

## 2019-07-07 DIAGNOSIS — E785 Hyperlipidemia, unspecified: Secondary | ICD-10-CM | POA: Diagnosis present

## 2019-07-07 DIAGNOSIS — E559 Vitamin D deficiency, unspecified: Secondary | ICD-10-CM | POA: Insufficient documentation

## 2019-07-07 DIAGNOSIS — Z0184 Encounter for antibody response examination: Secondary | ICD-10-CM | POA: Diagnosis present

## 2019-07-07 DIAGNOSIS — Z1159 Encounter for screening for other viral diseases: Secondary | ICD-10-CM | POA: Diagnosis not present

## 2019-07-07 DIAGNOSIS — D72829 Elevated white blood cell count, unspecified: Secondary | ICD-10-CM | POA: Diagnosis present

## 2019-07-07 LAB — CBC WITH DIFFERENTIAL/PLATELET
Abs Immature Granulocytes: 0.01 10*3/uL (ref 0.00–0.07)
Basophils Absolute: 0 10*3/uL (ref 0.0–0.1)
Basophils Relative: 1 %
Eosinophils Absolute: 0.2 10*3/uL (ref 0.0–0.5)
Eosinophils Relative: 6 %
HCT: 42.3 % (ref 36.0–46.0)
Hemoglobin: 14.1 g/dL (ref 12.0–15.0)
Immature Granulocytes: 0 %
Lymphocytes Relative: 46 %
Lymphs Abs: 1.8 10*3/uL (ref 0.7–4.0)
MCH: 31.9 pg (ref 26.0–34.0)
MCHC: 33.3 g/dL (ref 30.0–36.0)
MCV: 95.7 fL (ref 80.0–100.0)
Monocytes Absolute: 0.3 10*3/uL (ref 0.1–1.0)
Monocytes Relative: 8 %
Neutro Abs: 1.5 10*3/uL — ABNORMAL LOW (ref 1.7–7.7)
Neutrophils Relative %: 39 %
Platelets: 225 10*3/uL (ref 150–400)
RBC: 4.42 MIL/uL (ref 3.87–5.11)
RDW: 12.1 % (ref 11.5–15.5)
WBC: 3.9 10*3/uL — ABNORMAL LOW (ref 4.0–10.5)
nRBC: 0 % (ref 0.0–0.2)

## 2019-07-07 LAB — BASIC METABOLIC PANEL
Anion gap: 9 (ref 5–15)
BUN: 18 mg/dL (ref 6–20)
CO2: 25 mmol/L (ref 22–32)
Calcium: 9.4 mg/dL (ref 8.9–10.3)
Chloride: 107 mmol/L (ref 98–111)
Creatinine, Ser: 0.78 mg/dL (ref 0.44–1.00)
GFR calc Af Amer: >60 mL/min (ref >60)
GFR calc non Af Amer: >60 mL/min (ref >60)
Glucose, Bld: 95 mg/dL (ref 70–99)
Potassium: 4.2 mmol/L (ref 3.5–5.1)
Sodium: 141 mmol/L (ref 135–145)

## 2019-07-07 LAB — LIPID PANEL
Cholesterol: 202 mg/dL — ABNORMAL HIGH (ref 0–200)
HDL: 54 mg/dL (ref >40)
LDL Cholesterol: 135 mg/dL — ABNORMAL HIGH (ref 0–99)
Total CHOL/HDL Ratio: 3.7 RATIO
Triglycerides: 64 mg/dL (ref <150)
VLDL: 13 mg/dL (ref 0–40)

## 2019-07-07 LAB — IRON AND TIBC
Iron: 101 ug/dL (ref 28–170)
Saturation Ratios: 27 % (ref 10.4–31.8)
TIBC: 374 ug/dL (ref 250–450)
UIBC: 273 ug/dL

## 2019-07-07 LAB — FERRITIN: Ferritin: 44 ng/mL (ref 11–307)

## 2019-07-08 LAB — VITAMIN D 25 HYDROXY (VIT D DEFICIENCY, FRACTURES): Vit D, 25-Hydroxy: 27.3 ng/mL — ABNORMAL LOW (ref 30–100)

## 2019-07-08 LAB — HEPATITIS B SURFACE ANTIBODY, QUANTITATIVE: Hep B S AB Quant (Post): 164 m[IU]/mL (ref >9.9)

## 2019-09-24 ENCOUNTER — Encounter: Payer: Self-pay | Admitting: Nurse Practitioner

## 2019-09-24 ENCOUNTER — Ambulatory Visit (INDEPENDENT_AMBULATORY_CARE_PROVIDER_SITE_OTHER): Payer: No Typology Code available for payment source | Admitting: Nurse Practitioner

## 2019-09-24 ENCOUNTER — Other Ambulatory Visit: Payer: Self-pay

## 2019-09-24 VITALS — BP 122/60 | HR 113 | Temp 98.0°F | Ht 60.0 in | Wt 154.0 lb

## 2019-09-24 DIAGNOSIS — R300 Dysuria: Secondary | ICD-10-CM

## 2019-09-24 LAB — POCT URINALYSIS DIPSTICK
Bilirubin, UA: POSITIVE
Blood, UA: POSITIVE
Glucose, UA: POSITIVE — AB
Ketones, UA: POSITIVE
Nitrite, UA: POSITIVE
Protein, UA: POSITIVE — AB
Spec Grav, UA: >=1.03 — AB (ref 1.010–1.025)
Urobilinogen, UA: >=8 E.U./dL — AB
pH, UA: 5 (ref 5.0–8.0)

## 2019-09-24 LAB — URINALYSIS, MICROSCOPIC ONLY: RBC / HPF: NONE SEEN (ref 0–?)

## 2019-09-24 MED ORDER — NITROFURANTOIN MONOHYD MACRO 100 MG PO CAPS: 100.0000 mg | ORAL_CAPSULE | Freq: Two times a day (BID) | ORAL | 0 refills | Status: DC

## 2019-09-24 MED ORDER — CEPHALEXIN 500 MG PO CAPS: 500.0000 mg | ORAL_CAPSULE | Freq: Two times a day (BID) | ORAL | 0 refills | Status: DC

## 2019-09-24 NOTE — Progress Notes (Addendum)
Established Patient Office Visit  Subjective:  Patient ID: Kelly Crawford, female    DOB: 08-02-1990  Age: 29 y.o. MRN: [id number]  CC:  Chief Complaint  Patient presents with  . Acute Visit    urinary frequency, hematuria, burning, painful urination, pressure    HPI Kelly Crawford presents for onset urinary-frequency, burning and pressure- lower back and saw blood in her urine yesterday. She started on Azo. NO fever/chills. Normal diet. NO flank pain. Some bladder pressure. NO specific trigger. She has not had a UTI in over a year. From mid teen she has had them frequently. They usually respond well to antibiotic. She vomits with Augmentin.   Past Medical History:  Diagnosis Date  . Allergy    bee venom, pollen, dust, mold and mildew  . Pregnancy    due 12/29/2018 1st child    Past Surgical History:  Procedure Laterality Date  . ADENOIDECTOMY    . CESAREAN SECTION N/A 12/08/2018   Procedure: CESAREAN SECTION;  Surgeon: Candice Camp, MD;  Location: [facility] LD ORS;  Service: Obstetrics;  Laterality: N/A;  . tubes in ears    . WISDOM TOOTH EXTRACTION      Family History  Problem Relation Age of Onset  . Cancer Mother        breast dx'ed age 64  . Hyperlipidemia Maternal Grandmother   . Hypertension Maternal Grandmother   . Diabetes Maternal Grandmother        pre  . Cancer Maternal Grandfather        prostate  . Hypertension Maternal Grandfather   . Hyperlipidemia Maternal Grandfather   . Diabetes Maternal Grandfather        pre  . Hyperlipidemia Other     Social History   Socioeconomic History  . Marital status: Married    Spouse name: Not on file  . Number of children: Not on file  . Years of education: Not on file  . Highest education level: Not on file  Occupational History  . Not on file  Tobacco Use  . Smoking status: Never Smoker  . Smokeless tobacco: Never Used  Substance and Sexual Activity  . Alcohol use: Not Currently  . Drug use: Not  Currently  . Sexual activity: Yes  Other Topics Concern  . Not on file  Social History Narrative   Married    Expected baby girl 12/29/2018 Althea Charon   Works [employer] in [place] dept    Likes spending time with family and shopping    From [place] Darlis Wragg [phone]   Social Determinants of Health   Financial Resource Strain:   . Difficulty of Paying Living Expenses:   Food Insecurity:   . Worried About Programme researcher, broadcasting/film/video in the Last Year:   . Barista in the Last Year:   Transportation Needs:   . Freight forwarder (Medical):   Marland Kitchen Lack of Transportation (Non-Medical):   Physical Activity:   . Days of Exercise per Week:   . Minutes of Exercise per Session:   Stress:   . Feeling of Stress :   Social Connections:   . Frequency of Communication with Friends and Family:   . Frequency of Social Gatherings with Friends and Family:   . Attends Religious Services:   . Active Member of Clubs or Organizations:   . Attends Banker Meetings:   Marland Kitchen Marital Status:   Intimate Partner Violence:   . Fear of  Current or Ex-Partner:   . Emotionally Abused:   Marland Kitchen Physically Abused:   . Sexually Abused:     Outpatient Medications Prior to Visit  Medication Sig Dispense Refill  . fluticasone (FLONASE) 50 MCG/ACT nasal spray Place 2 sprays into both nostrils daily. 16 g 2  . norethindrone (MICRONOR) 0.35 MG tablet Take 1 tablet by mouth daily.    . Prenatal Vit-Fe Fumarate-FA (PRENATAL VITAMINS PO) Take 1 tablet by mouth.    . Vitamin D, Cholecalciferol, 50 MCG (2000 UT) CAPS Take 1 capsule by mouth daily.    . cephALEXin (KEFLEX) 500 MG capsule Take 1 capsule (500 mg total) by mouth 2 (two) times daily. (Patient not taking: Reported on 09/24/2019) 20 capsule 0   No facility-administered medications prior to visit.    Allergies  Allergen Reactions  . Bee Venom Hives  . Other Anaphylaxis    peas  . Milk-Related Compounds     hives  . Augmentin  [Amoxicillin-Pot Clavulanate]     vomiting     ROSReview of Systems  Constitutional: Negative for chills, fever and unexpected weight change.  HENT: Negative for congestion.   Eyes: Negative.   Respiratory: Negative for cough and shortness of breath.   Cardiovascular: Negative for chest pain.  Gastrointestinal: Negative for abdominal pain and diarrhea.  Endocrine: Negative.   Genitourinary: Positive for dysuria, frequency, hematuria and urgency.  Musculoskeletal: Positive for back pain.       Slight lower lumbar discomfort. No flank pain to percussion.   Skin: Negative for rash.  Neurological: Negative.   Hematological: Negative.   Psychiatric/Behavioral: Negative.       Objective:    Physical Exam  Constitutional: She is oriented to person, place, and time. She appears well-developed and well-nourished.  HENT:  Head: Normocephalic.  Eyes: Pupils are equal, round, and reactive to light.  Cardiovascular: Normal rate and regular rhythm.  Pulmonary/Chest: Effort normal and breath sounds normal.  Abdominal: Soft.  Musculoskeletal:        General: Normal range of motion.  Neurological: She is alert and oriented to person, place, and time.  Skin: Skin is warm and dry.  Psychiatric: She has a normal mood and affect. Her behavior is normal.  Vitals reviewed.   BP 122/60 (BP Location: Left Arm, Patient Position: Sitting, Cuff Size: Normal)   Pulse (!) 113   Temp 98 F (36.7 C) (Temporal)   Ht 5' (1.524 m)   Wt 154 lb (69.9 kg)   SpO2 97%   BMI 30.08 kg/m  Wt Readings from Last 3 Encounters:  09/24/19 154 lb (69.9 kg)  06/27/19 157 lb (71.2 kg)  03/19/19 160 lb (72.6 kg)     Health Maintenance Due  Topic Date Due  . COVID-19 Vaccine (1) Never done    There are no preventive care reminders to display for this patient.  Lab Results  Component Value Date   TSH 2.06 11/22/2018   Lab Results  Component Value Date   WBC 3.9 (L) 07/07/2019   HGB 14.1 07/07/2019     HCT 42.3 07/07/2019   MCV 95.7 07/07/2019   PLT 225 07/07/2019   Lab Results  Component Value Date   NA 141 07/07/2019   K 4.2 07/07/2019   CO2 25 07/07/2019   GLUCOSE 95 07/07/2019   BUN 18 07/07/2019   CREATININE 0.78 07/07/2019   BILITOT 0.7 11/22/2018   ALKPHOS 108 11/22/2018   AST 15 11/22/2018   ALT 17 11/22/2018   PROT  6.1 11/22/2018   ALBUMIN 3.5 11/22/2018   CALCIUM 9.4 07/07/2019   ANIONGAP 9 07/07/2019   GFR 105.56 11/22/2018   Lab Results  Component Value Date   CHOL 202 (H) 07/07/2019   Lab Results  Component Value Date   HDL 54 07/07/2019   Lab Results  Component Value Date   LDLCALC 135 (H) 07/07/2019   Lab Results  Component Value Date   TRIG 64 07/07/2019   Lab Results  Component Value Date   CHOLHDL 3.7 07/07/2019   No results found for: HGBA1C    Assessment & Plan:   Problem List Items Addressed This Visit    None    Visit Diagnoses    Dysuria    -  Primary   Relevant Orders   POCT Urinalysis Dipstick (Completed)   Urine Microscopic Only (Completed)   Urine Culture      Meds ordered this encounter  Medications  . DISCONTD: nitrofurantoin, macrocrystal-monohydrate, (MACROBID) 100 MG capsule    Sig: Take 1 capsule (100 mg total) by mouth 2 (two) times daily. Take with food.    Dispense:  10 capsule    Refill:  0    Order Specific Question:   Supervising Provider    Answer:   Einar Pheasant [id number]  . cephALEXin (KEFLEX) 500 MG capsule    Sig: Take 1 capsule (500 mg total) by mouth 2 (two) times daily for 7 days.    Dispense:  14 capsule    Refill:  0    Order Specific Question:   Supervising Provider    Answer:   Einar Pheasant [[id number]]   I have ordered a urine culture.  Your urine dip looks like you have a urinary tract infection.  Drink plenty of clear liquids, water, cranberry juice, and monitor for any worsening symptoms including fever, chills, pain, nausea or vomiting and seek medical attention if this  occurs.  I will check with your gynecology group regarding safe antibiotic while lactating.I checked with [facility] and [facility] and Keflex is approved.  The patient recalls taking Amoxicillin and Keflex in the past. Her Augmentin SE was GI distress as a child. She was advised to look for diarrhea in her 45 month old, and to use condoms as a second form of BC- which she does in addition to her birth control pill.   Addendum: Her urine culture is resistant to Keflex. I ordered Omnicef 300 mg BID for 7 days to [facility] as [facility] open.    Follow-up: Return if symptoms worsen or fail to improve.    Denice Paradise, NP

## 2019-09-24 NOTE — Patient Instructions (Addendum)
It was wonderful to meet you today.  I have ordered a urine culture.  Your urine dip looks like you have a urinary trac t infection.  Drink plenty of clear liquids, water, cranberry juice, and monitor for any worsening symptoms including fever, chills, pain, nausea or vomiting and seek medical attention if this occurs.  I will check with your gynecology group regarding safe antibiotic while lactating.  Urinary Tract Infection, Adult  A urinary tract infection (UTI) is an infection of any part of the urinary tract. The urinary tract includes the kidneys, ureters, bladder, and urethra. These organs make, store, and get rid of urine in the body. Your health care provider may use other names to describe the infection. An upper UTI affects the ureters and kidneys (pyelonephritis). A lower UTI affects the bladder (cystitis) and urethra (urethritis). What are the causes? Most urinary tract infections are caused by bacteria in your genital area, around the entrance to your urinary tract (urethra). These bacteria grow and cause inflammation of your urinary tract. What increases the risk? You are more likely to develop this condition if:  You have a urinary catheter that stays in place (indwelling).  You are not able to control when you urinate or have a bowel movement (you have incontinence).  You are female and you: ? Use a spermicide or diaphragm for birth control. ? Have low estrogen levels. ? Are pregnant.  You have certain genes that increase your risk (genetics).  You are sexually active.  You take antibiotic medicines.  You have a condition that causes your flow of urine to slow down, such as: ? An enlarged prostate, if you are female. ? Blockage in your urethra (stricture). ? A kidney stone. ? A nerve condition that affects your bladder control (neurogenic bladder). ? Not getting enough to drink, or not urinating often.  You have certain medical conditions, such  as: ? Diabetes. ? A weak disease-fighting system (immunesystem). ? Sickle cell disease. ? Gout. ? Spinal cord injury. What are the signs or symptoms? Symptoms of this condition include:  Needing to urinate right away (urgently).  Frequent urination or passing small amounts of urine frequently.  Pain or burning with urination.  Blood in the urine.  Urine that smells bad or unusual.  Trouble urinating.  Cloudy urine.  Vaginal discharge, if you are female.  Pain in the abdomen or the lower back. You may also have:  Vomiting or a decreased appetite.  Confusion.  Irritability or tiredness.  A fever.  Diarrhea. The first symptom in older adults may be confusion. In some cases, they may not have any symptoms until the infection has worsened. How is this diagnosed? This condition is diagnosed based on your medical history and a physical exam. You may also have other tests, including:  Urine tests.  Blood tests.  Tests for sexually transmitted infections (STIs). If you have had more than one UTI, a cystoscopy or imaging studies may be done to determine the cause of the infections. How is this treated? Treatment for this condition includes:  Antibiotic medicine.  Over-the-counter medicines to treat discomfort.  Drinking enough water to stay hydrated. If you have frequent infections or have other conditions such as a kidney stone, you may need to see a health care provider who specializes in the urinary tract (urologist). In rare cases, urinary tract infections can cause sepsis. Sepsis is a life-threatening condition that occurs when the body responds to an infection. Sepsis is treated in the hospital  with IV antibiotics, fluids, and other medicines. Follow these instructions at home:  Medicines  Take over-the-counter and prescription medicines only as told by your health care provider.  If you were prescribed an antibiotic medicine, take it as told by your health  care provider. Do not stop using the antibiotic even if you start to feel better. General instructions  Make sure you: ? Empty your bladder often and completely. Do not hold urine for long periods of time. ? Empty your bladder after sex. ? Wipe from front to back after a bowel movement if you are female. Use each tissue one time when you wipe.  Drink enough fluid to keep your urine pale yellow.  Keep all follow-up visits as told by your health care provider. This is important. Contact a health care provider if:  Your symptoms do not get better after 1-2 days.  Your symptoms go away and then return. Get help right away if you have:  Severe pain in your back or your lower abdomen.  A fever.  Nausea or vomiting. Summary  A urinary tract infection (UTI) is an infection of any part of the urinary tract, which includes the kidneys, ureters, bladder, and urethra.  Most urinary tract infections are caused by bacteria in your genital area, around the entrance to your urinary tract (urethra).  Treatment for this condition often includes antibiotic medicines.  If you were prescribed an antibiotic medicine, take it as told by your health care provider. Do not stop using the antibiotic even if you start to feel better.  Keep all follow-up visits as told by your health care provider. This is important. This information is not intended to replace advice given to you by your health care provider. Make sure you discuss any questions you have with your health care provider. Document Revised: 04/18/2018 Document Reviewed: 11/08/2017 Elsevier Patient Education  2020 [employer].

## 2019-09-26 ENCOUNTER — Telehealth: Payer: Self-pay | Admitting: Nurse Practitioner

## 2019-09-26 LAB — URINE CULTURE
MICRO NUMBER:: 10469164
SPECIMEN QUALITY:: ADEQUATE

## 2019-09-26 MED ORDER — CEFDINIR 300 MG PO CAPS: 300.0000 mg | ORAL_CAPSULE | Freq: Two times a day (BID) | ORAL | 0 refills | Status: DC

## 2019-09-26 NOTE — Addendum Note (Signed)
Addended by: Amedeo Kinsman A on: 09/26/2019 06:01 PM   Modules accepted: Orders

## 2019-09-29 NOTE — Telephone Encounter (Signed)
I called her and advised:  Her urine culture is resistant to Keflex. I ordered Omnicef 300 mg BID for 7 days to [facility] open. She is in agreement. Her baby has had no diarrhea with the Keflex that she took for a few days while waiting for this culture to return. She will monitor for any diarrhea or rash. Patient advised to take a probiotics.

## 2019-11-03 ENCOUNTER — Other Ambulatory Visit: Payer: Self-pay

## 2019-11-03 ENCOUNTER — Ambulatory Visit
Admission: EM | Admit: 2019-11-03 | Discharge: 2019-11-03 | Disposition: A | Discharge status: Home / Self Care | Payer: No Typology Code available for payment source | Attending: Emergency Medicine | Admitting: Emergency Medicine

## 2019-11-03 ENCOUNTER — Encounter: Payer: Self-pay | Admitting: Emergency Medicine

## 2019-11-03 DIAGNOSIS — J0100 Acute maxillary sinusitis, unspecified: Secondary | ICD-10-CM

## 2019-11-03 MED ORDER — AMOXICILLIN 875 MG PO TABS: 875.0000 mg | ORAL_TABLET | Freq: Two times a day (BID) | ORAL | 0 refills | Status: DC

## 2019-11-03 NOTE — ED Triage Notes (Signed)
Pt c/o sinus pain and pressure with ear pain and fullness in both ears x 2 weeks. Pt states that she has been taking mucinex, Dimetap, and flonase spray with some relief but symptoms have progressively gotten worse. She states her husband and daughter have similar symptoms. Pt was offered covid test but declined.

## 2019-11-03 NOTE — ED Provider Notes (Signed)
Renaldo Fiddler    CSN: [id number] Arrival date & time: 11/03/19  1729      History   Chief Complaint Chief Complaint  Patient presents with  . Sinusitis    HPI Kelly Crawford is a 29 y.o. female.   Patient presents with 2-week history of sinus pressure, congestion, postnasal drip, green-yellow mucus, cough, ear pain.  She has attempted treatment at home with Mucinex, Flonase, Dimetapp.  She denies fever, chills, sore throat, shortness of breath, vomiting, diarrhea, rash, or other symptoms.  Patient is currently breast-feeding.  She declines COVID testing.  The history is provided by the patient.    Past Medical History:  Diagnosis Date  . Allergy    bee venom, pollen, dust, mold and mildew  . Pregnancy    due 12/29/2018 1st child    Patient Active Problem List   Diagnosis Date Noted  . Normal labor 12/08/2018  . HLD (hyperlipidemia) 11/22/2018  . Vitamin D deficiency 11/22/2018  . Annual physical exam 10/25/2018    Past Surgical History:  Procedure Laterality Date  . ADENOIDECTOMY    . CESAREAN SECTION N/A 12/08/2018   Procedure: CESAREAN SECTION;  Surgeon: Candice Camp, MD;  Location: [facility] LD ORS;  Service: Obstetrics;  Laterality: N/A;  . tubes in ears    . WISDOM TOOTH EXTRACTION      OB History    Gravida  1   Para  1   Term  1   Preterm      AB      Living  1     SAB      TAB      Ectopic      Multiple  0   Live Births  1            Home Medications    Prior to Admission medications   Medication Sig Start Date End Date Taking? Authorizing Provider  fluticasone (FLONASE) 50 MCG/ACT nasal spray Place 2 sprays into both nostrils daily. 03/19/19  Yes Guse, Janna Arch, FNP  norethindrone (MICRONOR) 0.35 MG tablet Take 1 tablet by mouth daily.   Yes [provider]  Prenatal Vit-Fe Fumarate-FA (PRENATAL VITAMINS PO) Take 1 tablet by mouth.   Yes [provider]  Vitamin D, Cholecalciferol, 50 MCG (2000 UT) CAPS  Take 1 capsule by mouth daily.   Yes [provider]  amoxicillin (AMOXIL) 875 MG tablet Take 1 tablet (875 mg total) by mouth 2 (two) times daily for 7 days. 11/03/19 11/10/19  Mickie Bail, NP  cefdinir (OMNICEF) 300 MG capsule Take 1 capsule (300 mg total) by mouth 2 (two) times daily. 09/26/19   Theadore Nan, NP    Family History Family History  Problem Relation Age of Onset  . Cancer Mother        breast dx'ed age 11  . Hyperlipidemia Maternal Grandmother   . Hypertension Maternal Grandmother   . Diabetes Maternal Grandmother        pre  . Cancer Maternal Grandfather        prostate  . Hypertension Maternal Grandfather   . Hyperlipidemia Maternal Grandfather   . Diabetes Maternal Grandfather        pre  . Hyperlipidemia Other     Social History Social History   Tobacco Use  . Smoking status: Never Smoker  . Smokeless tobacco: Never Used  Substance Use Topics  . Alcohol use: Not Currently  . Drug use: Not Currently  Allergies   Bee venom, Other, Milk-related compounds, and Augmentin [amoxicillin-pot clavulanate]   Review of Systems Review of Systems  Constitutional: Negative for chills and fever.  HENT: Positive for congestion, ear pain, postnasal drip, rhinorrhea and sinus pressure. Negative for sore throat.   Eyes: Negative for pain and visual disturbance.  Respiratory: Positive for cough. Negative for shortness of breath.   Cardiovascular: Negative for chest pain and palpitations.  Gastrointestinal: Negative for abdominal pain, diarrhea, nausea and vomiting.  Genitourinary: Negative for dysuria and hematuria.  Musculoskeletal: Negative for arthralgias and back pain.  Skin: Negative for color change and rash.  Neurological: Negative for seizures and syncope.  All other systems reviewed and are negative.    Physical Exam Triage Vital Signs ED Triage Vitals  Enc Vitals Group     BP 11/03/19 1739 116/78     Pulse Rate 11/03/19 1739 (!) 105      Resp 11/03/19 1739 14     Temp 11/03/19 1739 98.8 F (37.1 C)     Temp Source 11/03/19 1739 Oral     SpO2 11/03/19 1739 97 %     Weight --      Height --      Head Circumference --      Peak Flow --      Pain Score 11/03/19 1732 7     Pain Loc --      Pain Edu? --      Excl. in GC? --    No data found.  Updated Vital Signs BP 116/78 (BP Location: Left Arm)   Pulse (!) 105   Temp 98.8 F (37.1 C) (Oral)   Resp 14   SpO2 97%   Breastfeeding Yes   Visual Acuity Right Eye Distance:   Left Eye Distance:   Bilateral Distance:    Right Eye Near:   Left Eye Near:    Bilateral Near:     Physical Exam Vitals and nursing note reviewed.  Constitutional:      General: She is not in acute distress.    Appearance: She is well-developed. She is not ill-appearing.  HENT:     Head: Normocephalic and atraumatic.     Right Ear: Tympanic membrane normal.     Left Ear: Tympanic membrane normal.     Nose: Congestion and rhinorrhea present.     Mouth/Throat:     Mouth: Mucous membranes are moist.     Pharynx: Posterior oropharyngeal erythema present.  Eyes:     Conjunctiva/sclera: Conjunctivae normal.  Cardiovascular:     Rate and Rhythm: Normal rate and regular rhythm.     Heart sounds: No murmur heard.   Pulmonary:     Effort: Pulmonary effort is normal. No respiratory distress.     Breath sounds: Normal breath sounds.  Abdominal:     Palpations: Abdomen is soft.     Tenderness: There is no abdominal tenderness. There is no guarding or rebound.  Musculoskeletal:     Cervical back: Neck supple.  Skin:    General: Skin is warm and dry.     Findings: No rash.  Neurological:     General: No focal deficit present.     Mental Status: She is alert and oriented to person, place, and time.     Gait: Gait normal.  Psychiatric:        Mood and Affect: Mood normal.        Behavior: Behavior normal.      UC Treatments / Results  Labs (all labs ordered are listed, but  only abnormal results are displayed) Labs Reviewed - No data to display  EKG   Radiology No results found.  Procedures Procedures (including critical care time)  Medications Ordered in UC Medications - No data to display  Initial Impression / Assessment and Plan / UC Course  I have reviewed the triage vital signs and the nursing notes.  Pertinent labs & imaging results that were available during my care of the patient were reviewed by me and considered in my medical decision making (see chart for details).   Acute sinusitis.  Treating with amoxicillin.  Instructed patient to continue taking Mucinex and ibuprofen.  Instructed patient to follow-up with her PCP if her symptoms or not improving.  Patient agrees to plan of care.     Final Clinical Impressions(s) / UC Diagnoses   Final diagnoses:  Acute non-recurrent maxillary sinusitis     Discharge Instructions     Take the amoxicillin as directed.    Follow up with your primary care provider if your symptoms are not improving.       ED Prescriptions    Medication Sig Dispense Auth. Provider   amoxicillin (AMOXIL) 875 MG tablet Take 1 tablet (875 mg total) by mouth 2 (two) times daily for 7 days. 14 tablet Sharion Balloon, NP     PDMP not reviewed this encounter.   Sharion Balloon, NP 11/03/19 1812

## 2019-11-03 NOTE — ED Notes (Signed)
Pt c/o sinus pain and pressure with ear pain and fullness x 2 weeks. Pt states she has been taking mucinex, Dimetap, and flonase nasal spray. Pt states it has progressively gotten worse. Pt denies fever.

## 2019-11-03 NOTE — Discharge Instructions (Signed)
Take the amoxicillin as directed.  Follow up with your primary care provider if your symptoms are not improving.   ° ° °

## 2019-11-06 ENCOUNTER — Telehealth: Payer: Self-pay | Admitting: Emergency Medicine

## 2019-11-06 NOTE — Telephone Encounter (Signed)
Patient contacted office requesting another antibiotic.  Per provider patient needs to f/u with primary care. Because of breast feeding.  Patient acknowledge understanding.

## 2019-11-07 ENCOUNTER — Telehealth: Payer: Self-pay

## 2019-11-07 ENCOUNTER — Ambulatory Visit (INDEPENDENT_AMBULATORY_CARE_PROVIDER_SITE_OTHER)
Admission: RE | Admit: 2019-11-07 | Discharge: 2019-11-07 | Disposition: A | Discharge status: Home / Self Care | Payer: No Typology Code available for payment source | Source: Ambulatory Visit

## 2019-11-07 DIAGNOSIS — J0111 Acute recurrent frontal sinusitis: Secondary | ICD-10-CM

## 2019-11-07 DIAGNOSIS — R059 Cough, unspecified: Secondary | ICD-10-CM

## 2019-11-07 MED ORDER — HYDROCODONE-HOMATROPINE 5-1.5 MG/5ML PO SYRP: 5.0000 mL | ORAL_SOLUTION | Freq: Four times a day (QID) | ORAL | 0 refills | Status: DC | PRN

## 2019-11-07 MED ORDER — SULFAMETHOXAZOLE-TRIMETHOPRIM 800-160 MG PO TABS: 1.0000 | ORAL_TABLET | Freq: Two times a day (BID) | ORAL | 0 refills | Status: AC

## 2019-11-07 NOTE — ED Provider Notes (Signed)
[facility]ENTER   [id number] 11/07/19 Arrival Time: 1336  TD:HRCB THROAT  SUBJECTIVE: History from: patient.  This visit was conducted over A/V media in accordance with Covid distancing guidelines. This NP, Jazzlin Clements was in the UC office at [facility] campus and the patient was in her own home. Instructed patient that if we cannot help her via video visit, she may need to come into be evaluated in person. Pt verbalized understanding and wishes to continue with the visit. Due to the nature of this visit, no physical exam was able to be performed.  Kelly Crawford is a 29 y.o. female who presents with persistent sinus pain/pressure, headache, nasal congestion, cough for the last 2 weeks. She was seen in the [place] [facility] on Monday of this week and that she was diagnosed with sinusitis. Reports that she has a day or two left of amoxicillin and that she is no better. Denies to sick exposure to Covid, strep, flu or mono, or precipitating event. Has tried amoxicillin without relief. There are no aggravating symptoms. Reports previous symptoms in the past. Reports that the cough is keeping her up at night. Reports that she is weaning breastfeeding. Reports that she is down to pumping maybe one time a day.  Denies fever, chills, fatigue, ear pain, SOB, wheezing, chest pain, nausea, rash, changes in bowel or bladder habits.     ROS: As per HPI.  All other pertinent ROS negative.     Past Medical History:  Diagnosis Date  . Allergy    bee venom, pollen, dust, mold and mildew  . Pregnancy    due 12/29/2018 1st child   Past Surgical History:  Procedure Laterality Date  . ADENOIDECTOMY    . CESAREAN SECTION N/A 12/08/2018   Procedure: CESAREAN SECTION;  Surgeon: Candice Camp, MD;  Location: [facility] LD ORS;  Service: Obstetrics;  Laterality: N/A;  . tubes in ears    . WISDOM TOOTH EXTRACTION     Allergies  Allergen Reactions  . Bee Venom Hives  . Other Anaphylaxis    peas  .  Milk-Related Compounds     hives  . Augmentin [Amoxicillin-Pot Clavulanate]     vomiting    No current facility-administered medications on file prior to encounter.   Current Outpatient Medications on File Prior to Encounter  Medication Sig Dispense Refill  . fluticasone (FLONASE) 50 MCG/ACT nasal spray Place 2 sprays into both nostrils daily. 16 g 2  . norethindrone (MICRONOR) 0.35 MG tablet Take 1 tablet by mouth daily.    . Prenatal Vit-Fe Fumarate-FA (PRENATAL VITAMINS PO) Take 1 tablet by mouth.    . Vitamin D, Cholecalciferol, 50 MCG (2000 UT) CAPS Take 1 capsule by mouth daily.     Social History   Socioeconomic History  . Marital status: Married    Spouse name: Not on file  . Number of children: Not on file  . Years of education: Not on file  . Highest education level: Not on file  Occupational History  . Not on file  Tobacco Use  . Smoking status: Never Smoker  . Smokeless tobacco: Never Used  Substance and Sexual Activity  . Alcohol use: Not Currently  . Drug use: Not Currently  . Sexual activity: Yes  Other Topics Concern  . Not on file  Social History Narrative   Married    Expected baby girl 12/29/2018 Althea Charon   Works [employer] in [place] dept    Likes spending time with family and shopping  From [facility] Maryellen Pile [phone]   Social Determinants of Health   Financial Resource Strain:   . Difficulty of Paying Living Expenses:   Food Insecurity:   . Worried About Programme researcher, broadcasting/film/video in the Last Year:   . Barista in the Last Year:   Transportation Needs:   . Freight forwarder (Medical):   Marland Kitchen Lack of Transportation (Non-Medical):   Physical Activity:   . Days of Exercise per Week:   . Minutes of Exercise per Session:   Stress:   . Feeling of Stress :   Social Connections:   . Frequency of Communication with Friends and Family:   . Frequency of Social Gatherings with Friends and Family:   . Attends Religious Services:   . Active  Member of Clubs or Organizations:   . Attends Banker Meetings:   Marland Kitchen Marital Status:   Intimate Partner Violence:   . Fear of Current or Ex-Partner:   . Emotionally Abused:   Marland Kitchen Physically Abused:   . Sexually Abused:    Family History  Problem Relation Age of Onset  . Cancer Mother        breast dx'ed age 20  . Hyperlipidemia Maternal Grandmother   . Hypertension Maternal Grandmother   . Diabetes Maternal Grandmother        pre  . Cancer Maternal Grandfather        prostate  . Hypertension Maternal Grandfather   . Hyperlipidemia Maternal Grandfather   . Diabetes Maternal Grandfather        pre  . Hyperlipidemia Other     OBJECTIVE:  There were no vitals filed for this visit.   General appearance: alert; appears fatigued, but nontoxic HEENT: NCAT Neck: FROM Lungs:speaking in full sentences Heart: no obvious edema  Skin: warm and dry Psychological: alert and cooperative; normal mood and affect  LABS: No results found for this or any previous visit (from the past 24 hour(s)).   ASSESSMENT & PLAN:  1. Acute recurrent frontal sinusitis     Meds ordered this encounter  Medications  . sulfamethoxazole-trimethoprim (BACTRIM DS) 800-160 MG tablet    Sig: Take 1 tablet by mouth 2 (two) times daily for 7 days.    Dispense:  14 tablet    Refill:  0    Order Specific Question:   Supervising Provider    Answer:   Merrilee Jansky [id number]  . DISCONTD: HYDROcodone-homatropine (HYCODAN) 5-1.5 MG/5ML syrup    Sig: Take 5 mLs by mouth every 6 (six) hours as needed for cough.    Dispense:  120 mL    Refill:  0    Order Specific Question:   Supervising Provider    Answer:   Merrilee Jansky [[id number]]    Acute Sinusitis Push fluids and get rest Prescribed Bactrim  Prescribed Hycodan Take as directed and to completion.  Take OTC ibuprofen or tylenol as needed for pain Follow up with PCP if symptoms persist Return or go to ER if you have any new or  worsening symptoms such as fever, chills, nausea, vomiting, worsening sore throat, cough, abdominal pain, chest pain, changes in bowel or bladder habits.   Reviewed expectations re: course of current medical issues. Questions answered. Outlined signs and symptoms indicating need for more acute intervention. Patient verbalized understanding. After Visit Summary given.  I spent 12 minutes non face to face time with this visit.  Faustino Congress, NP 11/07/19 1431

## 2019-11-07 NOTE — Discharge Instructions (Addendum)
I have sent in Bactrim to your pharmacy  Take this medication twice a day for one week.  If you are not starting to feel better by Monday

## 2019-11-07 NOTE — Telephone Encounter (Signed)
Patient called clinic stating that her pharmacy has called her and they are out of the prescribed cough medication. Would like it resent to the [facility] employee pharmacy.

## 2019-11-28 ENCOUNTER — Ambulatory Visit
Admission: RE | Admit: 2019-11-28 | Discharge: 2019-11-28 | Disposition: A | Discharge status: Home / Self Care | Payer: No Typology Code available for payment source | Source: Ambulatory Visit | Attending: Internal Medicine | Admitting: Internal Medicine

## 2019-11-28 ENCOUNTER — Telehealth (INDEPENDENT_AMBULATORY_CARE_PROVIDER_SITE_OTHER): Payer: No Typology Code available for payment source | Admitting: Internal Medicine

## 2019-11-28 ENCOUNTER — Ambulatory Visit
Admission: RE | Admit: 2019-11-28 | Discharge: 2019-11-28 | Disposition: A | Discharge status: Home / Self Care | Payer: No Typology Code available for payment source | Attending: Internal Medicine | Admitting: Internal Medicine

## 2019-11-28 ENCOUNTER — Other Ambulatory Visit: Payer: Self-pay | Admitting: Internal Medicine

## 2019-11-28 ENCOUNTER — Other Ambulatory Visit: Payer: Self-pay

## 2019-11-28 ENCOUNTER — Encounter: Payer: Self-pay | Admitting: Internal Medicine

## 2019-11-28 VITALS — Ht 60.0 in | Wt 152.0 lb

## 2019-11-28 DIAGNOSIS — J309 Allergic rhinitis, unspecified: Secondary | ICD-10-CM

## 2019-11-28 DIAGNOSIS — R053 Chronic cough: Secondary | ICD-10-CM

## 2019-11-28 DIAGNOSIS — J9801 Acute bronchospasm: Secondary | ICD-10-CM

## 2019-11-28 DIAGNOSIS — R05 Cough: Secondary | ICD-10-CM | POA: Insufficient documentation

## 2019-11-28 DIAGNOSIS — R059 Cough, unspecified: Secondary | ICD-10-CM

## 2019-11-28 DIAGNOSIS — J40 Bronchitis, not specified as acute or chronic: Secondary | ICD-10-CM

## 2019-11-28 DIAGNOSIS — J329 Chronic sinusitis, unspecified: Secondary | ICD-10-CM

## 2019-11-28 MED ORDER — PREDNISONE 20 MG PO TABS: 40.0000 mg | ORAL_TABLET | Freq: Every day | ORAL | 0 refills | Status: DC

## 2019-11-28 MED ORDER — LEVOCETIRIZINE DIHYDROCHLORIDE 5 MG PO TABS: 5.0000 mg | ORAL_TABLET | Freq: Every evening | ORAL | 3 refills | Status: DC

## 2019-11-28 MED ORDER — AZITHROMYCIN 250 MG PO TABS: ORAL_TABLET | ORAL | 0 refills | Status: DC

## 2019-11-28 MED ORDER — BUDESONIDE-FORMOTEROL FUMARATE 80-4.5 MCG/ACT IN AERO: 2.0000 | INHALATION_SPRAY | Freq: Two times a day (BID) | RESPIRATORY_TRACT | 11 refills | Status: DC

## 2019-11-28 MED ORDER — ALBUTEROL SULFATE HFA 108 (90 BASE) MCG/ACT IN AERS: 1.0000 | INHALATION_SPRAY | Freq: Four times a day (QID) | RESPIRATORY_TRACT | 11 refills | Status: DC | PRN

## 2019-11-28 MED ORDER — SALINE SPRAY 0.65 % NA SOLN: 2.0000 | NASAL | 11 refills | Status: DC | PRN

## 2019-11-28 NOTE — Progress Notes (Signed)
Virtual Visit via Video Note  I connected with Kelly Crawford  on 11/28/19 at 10:50 AM EDT by a video enabled telemedicine application and verified that I am speaking with the correct person using two identifiers.  Location patient: car Location provider:work or home office Persons participating in the virtual visit: patient, provider, girl child, and another person  I discussed the limitations of evaluation and management by telemedicine and the availability of in person appointments. The patient expressed understanding and agreed to proceed.   HPI: Recurrent uri/sinus issues and cough with green yellow mucous, nasal congestion She had 2 Abx amoxicillin 6/21 and bactrim 6/25 w/o relief of sx's bactrim cleared congestion but still has cough, her daughter Kelly Crawford is in daycare and since then she and her husband have been the most sick in their lives and husband has been sick w/in the last week.  She has tried mucinex dm, delsym, dimatap w/o relief and hycodan did not help her cough worse at night  She does have h/o allergies to dust/pollen, mold, mildew seasonal allergies and had allergy shots as a child.   Her cough is deep and worse at night denies fever. rec covid 19 testing for patient   ROS: See pertinent positives and negatives per HPI.  Past Medical History:  Diagnosis Date  . Allergy    bee venom, pollen, dust, mold and mildew  . Pregnancy    due 12/29/2018 1st child    Past Surgical History:  Procedure Laterality Date  . ADENOIDECTOMY    . CESAREAN SECTION N/A 12/08/2018   Procedure: CESAREAN SECTION;  Surgeon: Louretta Shorten, MD;  Location: [facility] LD ORS;  Service: Obstetrics;  Laterality: N/A;  . tubes in ears    . WISDOM TOOTH EXTRACTION      Family History  Problem Relation Age of Onset  . Cancer Mother        breast dx'ed age 38  . Hyperlipidemia Maternal Grandmother   . Hypertension Maternal Grandmother   . Diabetes Maternal Grandmother        pre  . Cancer  Maternal Grandfather        prostate  . Hypertension Maternal Grandfather   . Hyperlipidemia Maternal Grandfather   . Diabetes Maternal Grandfather        pre  . Hyperlipidemia Other     SOCIAL HX: married with 1 daughter    Current Outpatient Medications:  .  fluticasone (FLONASE) 50 MCG/ACT nasal spray, Place 2 sprays into both nostrils daily., Disp: 16 g, Rfl: 2 .  HYDROcodone-homatropine (HYCODAN) 5-1.5 MG/5ML syrup, Take 5 mLs by mouth every 6 (six) hours as needed for cough., Disp: 120 mL, Rfl: 0 .  norethindrone (MICRONOR) 0.35 MG tablet, Take 1 tablet by mouth daily., Disp: , Rfl:  .  Vitamin D, Cholecalciferol, 50 MCG (2000 UT) CAPS, Take 1 capsule by mouth daily., Disp: , Rfl:  .  albuterol (VENTOLIN HFA) 108 (90 Base) MCG/ACT inhaler, Inhale 1-2 puffs into the lungs every 6 (six) hours as needed for wheezing or shortness of breath., Disp: 18 g, Rfl: 11 .  azithromycin (ZITHROMAX) 250 MG tablet, 2 pills day 1 and 1 pill day 2-5 with food, Disp: 6 tablet, Rfl: 0 .  budesonide-formoterol (SYMBICORT) 80-4.5 MCG/ACT inhaler, Inhale 2 puffs into the lungs 2 (two) times daily. Rinse mouth, Disp: 1 Inhaler, Rfl: 11 .  levocetirizine (XYZAL) 5 MG tablet, Take 1 tablet (5 mg total) by mouth every evening. Stop allegra, Disp: 90 tablet, Rfl: 3 .  predniSONE (DELTASONE) 20 MG tablet, Take 2 tablets (40 mg total) by mouth daily with breakfast., Disp: 14 tablet, Rfl: 0 .  sodium chloride (OCEAN) 0.65 % SOLN nasal spray, Place 2 sprays into both nostrils as needed for congestion., Disp: 30 mL, Rfl: 11  EXAM:  VITALS per patient if applicable:  GENERAL: alert, oriented, appears well and in no acute distress  HEENT: atraumatic, conjunttiva clear, no obvious abnormalities on inspection of external nose and ears  NECK: normal movements of the head and neck  LUNGS: on inspection no signs of respiratory distress, breathing rate appears normal, no obvious gross SOB, gasping or wheezing  CV:  no obvious cyanosis  MS: moves all visible extremities without noticeable abnormality  PSYCH/NEURO: pleasant and cooperative, no obvious depression or anxiety, speech and thought processing grossly intact  ASSESSMENT AND PLAN:  Discussed the following assessment and plan:  Persistent cough ?bronchitis vs asthma due to allergies- Plan: DG Chest 2 View, azithromycin (ZITHROMAX) 250 MG tablet, budesonide-formoterol (SYMBICORT) 80-4.5 MCG/ACT inhaler, albuterol (VENTOLIN HFA) 108 (90 Base) MCG/ACT inhaler, prednisone 40 mg qam Had been on amoxicillin 6/21 and bactrim 11/07/19 w/o relief  Tried hycodan, mucinex dm, delsym, dimatap w/o relief of cough at night  rec covid 19 testing since daughters daycare has virus going around and husband not feeling well  Consider w/u with allergist in future to reconsider allergy shots or w/u asthma pt denies h/o asthma but does have h/o allergies and has been on allergy shots in th epast Change to xyzal  -refer to ENT and consider referral to allergy in the future see above  Allergic rhinitis, unspecified seasonality, unspecified trigger - Plan: levocetirizine (XYZAL) 5 MG tablet  Sinusitis, unspecified chronicity, unspecified location - Plan: sodium chloride (OCEAN) 0.65 % SOLN nasal spray, levocetirizine (XYZAL) 5 MG tablet -refer to ENT  HM rec healthy diet and exercise  Flu shot utd 03/05/19 Tdap had 10/04/18  covid 19 vx will wait breastfeeding  Hep B immune Consider MMR in future checked Rubella status ob/gyn in pregnancy Rubella abs 3.69 + Consider mumps and rubeola  Pap per pt had 06/2018 need to copy copy requested today from ob/gyn -pap 06/07/18 negative no HPV testing done scanned into chart Dr. Gaetano Net ob/gyn   -HIV neg 10/04/18, HCV neg 05/30/18, HBsAg negative  CBC 8.3 13.0/38.6/190     -we discussed possible serious and likely etiologies, options for evaluation and workup, limitations of telemedicine visit vs in person visit,  treatment, treatment risks and precautions. Pt prefers to treat via telemedicine empirically rather then risking or undertaking an in person visit at this moment. Patient agrees to seek prompt in person care if worsening, new symptoms arise, or if is not improving with treatment.   I discussed the assessment and treatment plan with the patient. The patient was provided an opportunity to ask questions and all were answered. The patient agreed with the plan and demonstrated an understanding of the instructions.   The patient was advised to call back or seek an in-person evaluation if the symptoms worsen or if the condition fails to improve as anticipated.   Kelly Glow McLean-Scocuzza, MD

## 2019-11-28 NOTE — Progress Notes (Signed)
Ongoing sinus infection for the last month and a half.  Congestion, cough, facial pressure, green/yellow mucous.  Has been on antibiotics, amoxicillin and did not help. Was on a second round that helped the congestion. Once finished she got sick again.  No Covid test was done.

## 2019-12-01 ENCOUNTER — Encounter: Payer: Self-pay | Admitting: Internal Medicine

## 2019-12-01 DIAGNOSIS — J309 Allergic rhinitis, unspecified: Secondary | ICD-10-CM | POA: Insufficient documentation

## 2019-12-11 ENCOUNTER — Other Ambulatory Visit: Payer: Self-pay | Admitting: Obstetrics and Gynecology

## 2019-12-22 ENCOUNTER — Telehealth: Payer: Self-pay | Admitting: Internal Medicine

## 2019-12-22 NOTE — Telephone Encounter (Signed)
LVM FOR PT TO CB TO SCHED APPT" [facility] [place] said 19 days ago  "Rejection Reason - Patient did not respond - pt did not cb to sched appt" [facility] [place] said 10 days ago

## 2020-04-06 ENCOUNTER — Other Ambulatory Visit: Payer: Self-pay | Admitting: Obstetrics and Gynecology

## 2020-04-16 ENCOUNTER — Ambulatory Visit: Payer: No Typology Code available for payment source | Admitting: Internal Medicine

## 2020-05-03 ENCOUNTER — Other Ambulatory Visit: Payer: Self-pay | Admitting: Obstetrics and Gynecology

## 2020-06-10 ENCOUNTER — Other Ambulatory Visit: Payer: No Typology Code available for payment source

## 2020-06-10 DIAGNOSIS — Z20822 Contact with and (suspected) exposure to covid-19: Secondary | ICD-10-CM

## 2020-06-11 ENCOUNTER — Telehealth (INDEPENDENT_AMBULATORY_CARE_PROVIDER_SITE_OTHER): Payer: No Typology Code available for payment source | Admitting: Internal Medicine

## 2020-06-11 ENCOUNTER — Other Ambulatory Visit: Payer: Self-pay | Admitting: Internal Medicine

## 2020-06-11 ENCOUNTER — Encounter: Payer: Self-pay | Admitting: Internal Medicine

## 2020-06-11 ENCOUNTER — Other Ambulatory Visit: Payer: Self-pay

## 2020-06-11 VITALS — Ht 60.0 in | Wt 158.0 lb

## 2020-06-11 DIAGNOSIS — E559 Vitamin D deficiency, unspecified: Secondary | ICD-10-CM

## 2020-06-11 DIAGNOSIS — Z0000 Encounter for general adult medical examination without abnormal findings: Secondary | ICD-10-CM

## 2020-06-11 DIAGNOSIS — Z0001 Encounter for general adult medical examination with abnormal findings: Secondary | ICD-10-CM

## 2020-06-11 DIAGNOSIS — D72819 Decreased white blood cell count, unspecified: Secondary | ICD-10-CM

## 2020-06-11 DIAGNOSIS — J40 Bronchitis, not specified as acute or chronic: Secondary | ICD-10-CM

## 2020-06-11 DIAGNOSIS — Z13220 Encounter for screening for lipoid disorders: Secondary | ICD-10-CM

## 2020-06-11 DIAGNOSIS — J309 Allergic rhinitis, unspecified: Secondary | ICD-10-CM

## 2020-06-11 DIAGNOSIS — Z1329 Encounter for screening for other suspected endocrine disorder: Secondary | ICD-10-CM

## 2020-06-11 DIAGNOSIS — E538 Deficiency of other specified B group vitamins: Secondary | ICD-10-CM

## 2020-06-11 DIAGNOSIS — Z1283 Encounter for screening for malignant neoplasm of skin: Secondary | ICD-10-CM

## 2020-06-11 DIAGNOSIS — J019 Acute sinusitis, unspecified: Secondary | ICD-10-CM

## 2020-06-11 DIAGNOSIS — J329 Chronic sinusitis, unspecified: Secondary | ICD-10-CM

## 2020-06-11 DIAGNOSIS — Z20822 Contact with and (suspected) exposure to covid-19: Secondary | ICD-10-CM

## 2020-06-11 DIAGNOSIS — R053 Chronic cough: Secondary | ICD-10-CM

## 2020-06-11 DIAGNOSIS — H9203 Otalgia, bilateral: Secondary | ICD-10-CM

## 2020-06-11 DIAGNOSIS — N3000 Acute cystitis without hematuria: Secondary | ICD-10-CM

## 2020-06-11 LAB — NOVEL CORONAVIRUS, NAA: SARS-CoV-2, NAA: NOT DETECTED

## 2020-06-11 LAB — SARS-COV-2, NAA 2 DAY TAT

## 2020-06-11 MED ORDER — ALBUTEROL SULFATE HFA 108 (90 BASE) MCG/ACT IN AERS: 1.0000 | INHALATION_SPRAY | Freq: Four times a day (QID) | RESPIRATORY_TRACT | 11 refills | Status: DC | PRN

## 2020-06-11 MED ORDER — BUDESONIDE-FORMOTEROL FUMARATE 80-4.5 MCG/ACT IN AERO: 2.0000 | INHALATION_SPRAY | Freq: Two times a day (BID) | RESPIRATORY_TRACT | 11 refills | Status: DC

## 2020-06-11 MED ORDER — SALINE SPRAY 0.65 % NA SOLN: 2.0000 | NASAL | 11 refills | Status: DC | PRN

## 2020-06-11 MED ORDER — FLUTICASONE PROPIONATE 50 MCG/ACT NA SUSP: 2.0000 | Freq: Every day | NASAL | 11 refills | Status: DC

## 2020-06-11 MED ORDER — LEVOCETIRIZINE DIHYDROCHLORIDE 5 MG PO TABS: 5.0000 mg | ORAL_TABLET | Freq: Every evening | ORAL | 3 refills | Status: DC

## 2020-06-11 NOTE — Addendum Note (Signed)
Addended by: Quentin Ore on: 06/11/2020 01:01 PM   Modules accepted: Orders

## 2020-06-11 NOTE — Progress Notes (Signed)
Virtual Visit via Video Note  I connected with Kelly Crawford  on 06/11/20 at  8:00 AM EST by a video enabled telemedicine application and verified that I am speaking with the correct person using two identifiers.  Location patient: home, [place] Location provider:work or home office Persons participating in the virtual visit: patient, provider  I discussed the limitations of evaluation and management by telemedicine and the availability of in person appointments. The patient expressed understanding and agreed to proceed.   HPI: 1. Annual -she asks if this virtual can be CPE will CC billing Doing well except nasal congestion exposed to mom Sunday who tested + covid Monday and 52 mo old daughter had cold last week though negative covid but getting repeat test and she was tested for covid yesterday BFP only sx nasal congestion, no cough, sob   ROS: See pertinent positives and negatives per HPI.  Past Medical History:  Diagnosis Date  . Allergy    bee venom, pollen, dust, mold and mildew h/o allergy shots at child  . Blood type O+   . Pregnancy    due 12/29/2018 1st child    Past Surgical History:  Procedure Laterality Date  . ADENOIDECTOMY    . CESAREAN SECTION N/A 12/08/2018   Procedure: CESAREAN SECTION;  Surgeon: Louretta Shorten, MD;  Location: [facility] LD ORS;  Service: Obstetrics;  Laterality: N/A;  . tubes in ears    . WISDOM TOOTH EXTRACTION       Current Outpatient Medications:  .  etonogestrel-ethinyl estradiol (NUVARING) 0.12-0.015 MG/24HR vaginal ring, etonogestrel 0.12 mg-ethinyl estradiol 0.015 mg/24 hr vaginal ring  INSERT 1 RING VAGINALLY EVERY MONTH AS DIRECTED, Disp: , Rfl:  .  Vitamin D, Cholecalciferol, 50 MCG (2000 UT) CAPS, Take 1 capsule by mouth daily., Disp: , Rfl:  .  albuterol (VENTOLIN HFA) 108 (90 Base) MCG/ACT inhaler, Inhale 1-2 puffs into the lungs every 6 (six) hours as needed for wheezing or shortness of breath., Disp: 18 g, Rfl: 11 .  budesonide-formoterol  (SYMBICORT) 80-4.5 MCG/ACT inhaler, Inhale 2 puffs into the lungs 2 (two) times daily. Rinse mouth, Disp: 1 each, Rfl: 11 .  fluticasone (FLONASE) 50 MCG/ACT nasal spray, Place 2 sprays into both nostrils daily., Disp: 16 g, Rfl: 11 .  levocetirizine (XYZAL) 5 MG tablet, Take 1 tablet (5 mg total) by mouth every evening. Stop allegra, Disp: 90 tablet, Rfl: 3 .  sodium chloride (OCEAN) 0.65 % SOLN nasal spray, Place 2 sprays into both nostrils as needed for congestion., Disp: 30 mL, Rfl: 11  EXAM:  VITALS per patient if applicable:  GENERAL: alert, oriented, appears well and in no acute distress  HEENT: atraumatic, conjunttiva clear, no obvious abnormalities on inspection of external nose and ears  NECK: normal movements of the head and neck  LUNGS: on inspection no signs of respiratory distress, breathing rate appears normal, no obvious gross SOB, gasping or wheezing  CV: no obvious cyanosis  MS: moves all visible extremities without noticeable abnormality  PSYCH/NEURO: pleasant and cooperative, no obvious depression or anxiety, speech and thought processing grossly intact  ASSESSMENT AND PLAN:  Discussed the following assessment and plan:  Annual physical exam Flu shot utd10/2021 Tdap had 10/04/18 covid 19 vx declines  Hep B immune Consider MMR in futurechecked Rubella status ob/gyn in pregnancy Rubella abs 3.69 + Consider mumps and rubeola Pap per pt had 06/2018 need to copy copy requested today from ob/gyn -pap 06/07/18 negative no HPV testing done scanned into chart Dr. Gaetano Net  ob/gyn f/u sch 07/2020  rec healthy diet and exercise -HIV neg 10/04/18, HCV neg 05/30/18, HBsAg negative  CBC 8.3 13.0/38.6/190   Sinusitis, history with nasal congestion today exposure ot covid - Plan: sodium chloride (OCEAN) 0.65 % SOLN nasal spray, levocetirizine (XYZAL) 5 MG tablet, flonase Allergic rhinitis, unspecified seasonality, unspecified trigger - Plan: levocetirizine (XYZAL) 5 MG  tablet H/o Bronchitis - Plan: budesonide-formoterol (SYMBICORT) 80-4.5 MCG/ACT inhaler, albuterol (VENTOLIN HFA) 108 (90 Base) MCG/ACT inhaler  -pending covid 19 test  There is no medication other than over the counter meds:  Mucinex dm green label for cough.  Vitamin C 1000 mg daily.  Vitamin D3 4000 Iu (units) daily.  Zinc 100 mg daily.  Quercetin 250-500 mg 2 times per day   Elderberry  Oil of oregano  cepacol or chloroseptic spray  Warm tea with honey and lemon  Hydration  Try to eat though you dont feel like it   Tylenol or Advil  Nasal saline  Flonase  Continue xyzal Monitor pulse oximeter, buy from [facility] if oxygen is less than 90 please go to the hospital.        Are you feeling really sick? Shortness of breath, cough, chest pain?, dizziness? Confusion   If so let me know  If worsening, go to hospital or [facility] clinic Urgent care for further treatment      -we discussed possible serious and likely etiologies, options for evaluation and workup, limitations of telemedicine visit vs in person visit, treatment, treatment risks and precautions.    I discussed the assessment and treatment plan with the patient. The patient was provided an opportunity to ask questions and all were answered. The patient agreed with the plan and demonstrated an understanding of the instructions.    Time spent 20 min Delorise Jackson, MD

## 2020-07-26 ENCOUNTER — Other Ambulatory Visit: Payer: Self-pay | Admitting: Obstetrics and Gynecology

## 2020-07-30 LAB — HM PAP SMEAR: HM Pap smear: NORMAL

## 2020-08-18 MED FILL — Fluticasone Propionate Nasal Susp 50 MCG/ACT: NASAL | 30 days supply | Qty: 16 | Fill #0 | Status: AC

## 2020-08-19 ENCOUNTER — Other Ambulatory Visit: Payer: Self-pay

## 2020-08-20 ENCOUNTER — Other Ambulatory Visit: Payer: Self-pay

## 2020-08-20 MED FILL — Levocetirizine Dihydrochloride Tab 5 MG: ORAL | 90 days supply | Qty: 90 | Fill #0 | Status: AC

## 2020-09-13 ENCOUNTER — Telehealth: Payer: No Typology Code available for payment source | Admitting: Family

## 2020-09-13 ENCOUNTER — Other Ambulatory Visit: Payer: Self-pay

## 2020-09-13 DIAGNOSIS — R399 Unspecified symptoms and signs involving the genitourinary system: Secondary | ICD-10-CM

## 2020-09-13 MED ORDER — SULFAMETHOXAZOLE-TRIMETHOPRIM 800-160 MG PO TABS
1.0000 | ORAL_TABLET | Freq: Two times a day (BID) | ORAL | 0 refills | Status: DC
  Filled 2020-09-13: qty 14, 7d supply, fill #0

## 2020-09-13 NOTE — Progress Notes (Signed)
We are sorry that you are not feeling well.  Here is how we plan to help!  Based on what you shared with me it looks like you most likely have a simple urinary tract infection.  A UTI (Urinary Tract Infection) is a bacterial infection of the bladder.  Most cases of urinary tract infections are simple to treat but a key part of your care is to encourage you to drink plenty of fluids and watch your symptoms carefully.  I have prescribed Bactrim DS One tablet twice a day for 7 days.  Your symptoms should gradually improve. Call us if the burning in your urine worsens, you develop worsening fever, back pain or pelvic pain or if your symptoms do not resolve after completing the antibiotic.  Urinary tract infections can be prevented by drinking plenty of water to keep your body hydrated.  Also be sure when you wipe, wipe from front to back and don't hold it in!  If possible, empty your bladder every 4 hours.  Your e-visit answers were reviewed by a board certified advanced clinical practitioner to complete your personal care plan.  Depending on the condition, your plan could have included both over the counter or prescription medications.  If there is a problem please reply  once you have received a response from your provider.  Your safety is important to [place].  If you have drug allergies check your prescription carefully.    You can use MyChart to ask questions about today's visit, request a non-urgent call back, or ask for a work or school excuse for 24 hours related to this e-Visit. If it has been greater than 24 hours you will need to follow up with your provider, or enter a new e-Visit to address those concerns.   You will get an e-mail in the next two days asking about your experience.  I hope that your e-visit has been valuable and will speed your recovery. Thank you for using e-visits.   Approximately 5 minutes was spent documenting and reviewing patient's chart.

## 2020-09-22 ENCOUNTER — Telehealth: Payer: No Typology Code available for payment source | Admitting: Physician Assistant

## 2020-09-22 DIAGNOSIS — T3695XA Adverse effect of unspecified systemic antibiotic, initial encounter: Secondary | ICD-10-CM | POA: Diagnosis not present

## 2020-09-22 DIAGNOSIS — B379 Candidiasis, unspecified: Secondary | ICD-10-CM

## 2020-09-23 ENCOUNTER — Other Ambulatory Visit: Payer: Self-pay

## 2020-09-23 MED ORDER — FLUCONAZOLE 150 MG PO TABS
150.0000 mg | ORAL_TABLET | Freq: Once | ORAL | 0 refills | Status: AC
  Filled 2020-09-23: qty 1, 1d supply, fill #0

## 2020-09-23 NOTE — Progress Notes (Signed)
I have spent 5 minutes in review of e-visit questionnaire, review and updating patient chart, medical decision making and response to patient.   Zen Felling Cody Nadege Carriger, PA-C    

## 2020-09-23 NOTE — Progress Notes (Signed)

## 2020-10-04 ENCOUNTER — Other Ambulatory Visit: Payer: Self-pay

## 2020-10-04 MED ORDER — CARESTART COVID-19 HOME TEST VI KIT
PACK | 0 refills | Status: DC
  Filled 2020-10-04: qty 2, 4d supply, fill #0

## 2020-10-24 ENCOUNTER — Other Ambulatory Visit: Payer: Self-pay | Admitting: Pharmacist

## 2020-10-24 MED FILL — Etonogestrel-Ethinyl Estradiol VA Ring 0.12-0.015 MG/24HR: VAGINAL | 90 days supply | Qty: 3 | Fill #0 | Status: AC

## 2020-10-25 ENCOUNTER — Other Ambulatory Visit: Payer: Self-pay

## 2020-11-09 ENCOUNTER — Telehealth: Payer: No Typology Code available for payment source | Admitting: Nurse Practitioner

## 2020-11-09 DIAGNOSIS — J0100 Acute maxillary sinusitis, unspecified: Secondary | ICD-10-CM | POA: Diagnosis not present

## 2020-11-10 ENCOUNTER — Other Ambulatory Visit: Payer: Self-pay

## 2020-11-10 MED ORDER — DOXYCYCLINE HYCLATE 100 MG PO TABS
100.0000 mg | ORAL_TABLET | Freq: Two times a day (BID) | ORAL | 0 refills | Status: DC
  Filled 2020-11-10: qty 20, 10d supply, fill #0

## 2020-11-10 MED ORDER — CARESTART COVID-19 HOME TEST VI KIT
PACK | 0 refills | Status: DC
  Filled 2020-11-10: qty 4, 4d supply, fill #0

## 2020-11-10 NOTE — Progress Notes (Signed)

## 2020-12-13 ENCOUNTER — Other Ambulatory Visit: Payer: Self-pay

## 2020-12-13 ENCOUNTER — Telehealth: Payer: No Typology Code available for payment source | Admitting: Physician Assistant

## 2020-12-13 DIAGNOSIS — B9689 Other specified bacterial agents as the cause of diseases classified elsewhere: Secondary | ICD-10-CM | POA: Diagnosis not present

## 2020-12-13 DIAGNOSIS — J019 Acute sinusitis, unspecified: Secondary | ICD-10-CM

## 2020-12-13 MED ORDER — DOXYCYCLINE HYCLATE 100 MG PO TABS
100.0000 mg | ORAL_TABLET | Freq: Two times a day (BID) | ORAL | 0 refills | Status: DC
  Filled 2020-12-13: qty 20, 10d supply, fill #0

## 2020-12-13 NOTE — Progress Notes (Signed)

## 2020-12-15 ENCOUNTER — Other Ambulatory Visit: Payer: Self-pay

## 2020-12-15 MED ORDER — BENZONATATE 100 MG PO CAPS
100.0000 mg | ORAL_CAPSULE | Freq: Three times a day (TID) | ORAL | 0 refills | Status: DC | PRN
  Filled 2020-12-15: qty 20, 7d supply, fill #0

## 2020-12-15 NOTE — Addendum Note (Signed)
Addended by: Bennie Pierini on: 12/15/2020 07:24 AM   Modules accepted: Orders

## 2021-01-09 MED FILL — Fluticasone Propionate Nasal Susp 50 MCG/ACT: NASAL | 30 days supply | Qty: 16 | Fill #1 | Status: AC

## 2021-01-09 MED FILL — Levocetirizine Dihydrochloride Tab 5 MG: ORAL | 90 days supply | Qty: 90 | Fill #1 | Status: AC

## 2021-01-10 ENCOUNTER — Other Ambulatory Visit: Payer: Self-pay

## 2021-01-15 ENCOUNTER — Telehealth: Payer: No Typology Code available for payment source | Admitting: Family

## 2021-01-15 DIAGNOSIS — J208 Acute bronchitis due to other specified organisms: Secondary | ICD-10-CM | POA: Diagnosis not present

## 2021-01-15 DIAGNOSIS — B9689 Other specified bacterial agents as the cause of diseases classified elsewhere: Secondary | ICD-10-CM

## 2021-01-16 MED ORDER — BENZONATATE 100 MG PO CAPS: 100.0000 mg | ORAL_CAPSULE | Freq: Three times a day (TID) | ORAL | 0 refills | Status: DC | PRN

## 2021-01-16 MED ORDER — AZITHROMYCIN 250 MG PO TABS: ORAL_TABLET | ORAL | 0 refills | Status: DC

## 2021-01-16 NOTE — Progress Notes (Signed)
We are sorry that you are not feeling well.  Here is how we plan to help!  Based on your presentation I believe you most likely have A cough due to bacteria.  When patients have a fever and a productive cough with a change in color or increased sputum production, we are concerned about bacterial bronchitis.  If left untreated it can progress to pneumonia.  If your symptoms do not improve with your treatment plan it is important that you contact your provider.   I have prescribed Azithromyin 250 mg: two tablets now and then one tablet daily for 4 additonal days    In addition you may use A non-prescription cough medication called Robitussin DAC. Take 2 teaspoons every 8 hours or Delsym: take 2 teaspoons every 12 hours., A non-prescription cough medication called Mucinex DM: take 2 tablets every 12 hours., and A prescription cough medication called Tessalon Perles 100mg . You may take 1-2 capsules every 8 hours as needed for your cough.  It appears you were just treated with antibiotics on 12/15/20, if your symptoms return you need to be seen in person. Please make sure you are taking your xyzal 5 mg daily and flonase daily.   From your responses in the eVisit questionnaire you describe inflammation in the upper respiratory tract which is causing a significant cough.  This is commonly called Bronchitis and has four common causes:   Allergies Viral Infections Acid Reflux Bacterial Infection Allergies, viruses and acid reflux are treated by controlling symptoms or eliminating the cause. An example might be a cough caused by taking certain blood pressure medications. You stop the cough by changing the medication. Another example might be a cough caused by acid reflux. Controlling the reflux helps control the cough.  USE OF BRONCHODILATOR ("RESCUE") INHALERS: There is a risk from using your bronchodilator too frequently.  The risk is that over-reliance on a medication which only relaxes the muscles  surrounding the breathing tubes can reduce the effectiveness of medications prescribed to reduce swelling and congestion of the tubes themselves.  Although you feel brief relief from the bronchodilator inhaler, your asthma may actually be worsening with the tubes becoming more swollen and filled with mucus.  This can delay other crucial treatments, such as oral steroid medications. If you need to use a bronchodilator inhaler daily, several times per day, you should discuss this with your provider.  There are probably better treatments that could be used to keep your asthma under control.     HOME CARE Only take medications as instructed by your medical team. Complete the entire course of an antibiotic. Drink plenty of fluids and get plenty of rest. Avoid close contacts especially the very young and the elderly Cover your mouth if you cough or cough into your sleeve. Always remember to wash your hands A steam or ultrasonic humidifier can help congestion.   GET HELP RIGHT AWAY IF: You develop worsening fever. You become short of breath You cough up blood. Your symptoms persist after you have completed your treatment plan MAKE SURE YOU  Understand these instructions. Will watch your condition. Will get help right away if you are not doing well or get worse.    Thank you for choosing an e-visit.  Your e-visit answers were reviewed by a board certified advanced clinical practitioner to complete your personal care plan. Depending upon the condition, your plan could have included both over the counter or prescription medications.  Please review your pharmacy choice. Make sure the  pharmacy is open so you can pick up prescription now. If there is a problem, you may contact your provider through [employer] and have the prescription routed to another pharmacy.  Your safety is important to [place]. If you have drug allergies check your prescription carefully.   For the next 24 hours you can use  MyChart to ask questions about today's visit, request a non-urgent call back, or ask for a work or school excuse. You will get an email in the next two days asking about your experience. I hope that your e-visit has been valuable and will speed your recovery.  Approximately 5 minutes was spent documenting and reviewing patient's chart.

## 2021-01-21 ENCOUNTER — Other Ambulatory Visit: Payer: Self-pay | Admitting: Family

## 2021-02-02 ENCOUNTER — Other Ambulatory Visit: Payer: Self-pay

## 2021-02-02 MED FILL — Etonogestrel-Ethinyl Estradiol VA Ring 0.12-0.015 MG/24HR: VAGINAL | 90 days supply | Qty: 3 | Fill #1 | Status: AC

## 2021-02-11 IMAGING — CR DG CHEST 2V
1 series · 2 of 2 positions shown · non-contrast
Comparison: None.

CLINICAL DATA: Persistent intermittently productive cough x
months. Patient states that she had a sinus infection that cleared
with antibiotics and cleared congestion. Congestion has now
returned.

EXAM:
CHEST - 2 VIEW

[Series 1: dg chest 2 view · 0.14mm/px · 2 of 2 slices shown]
[im 1/2]
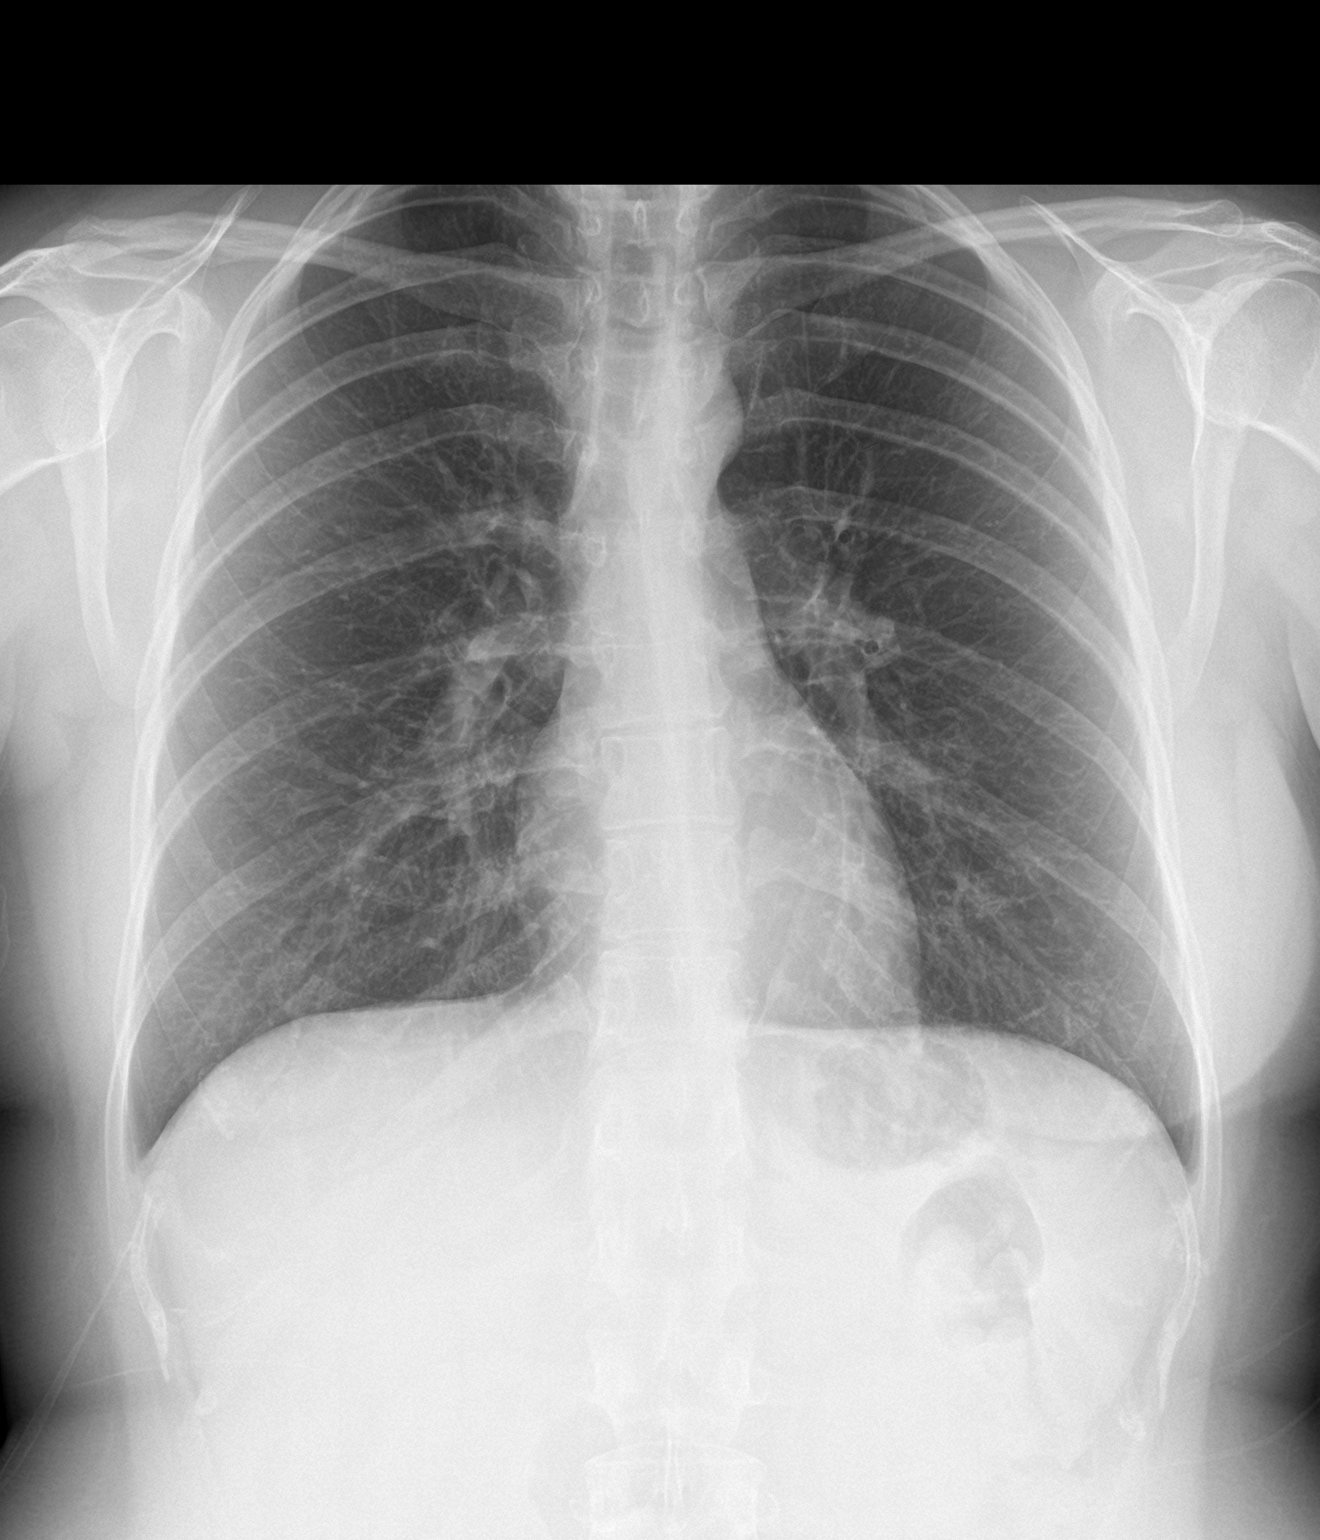
[im 2/2]
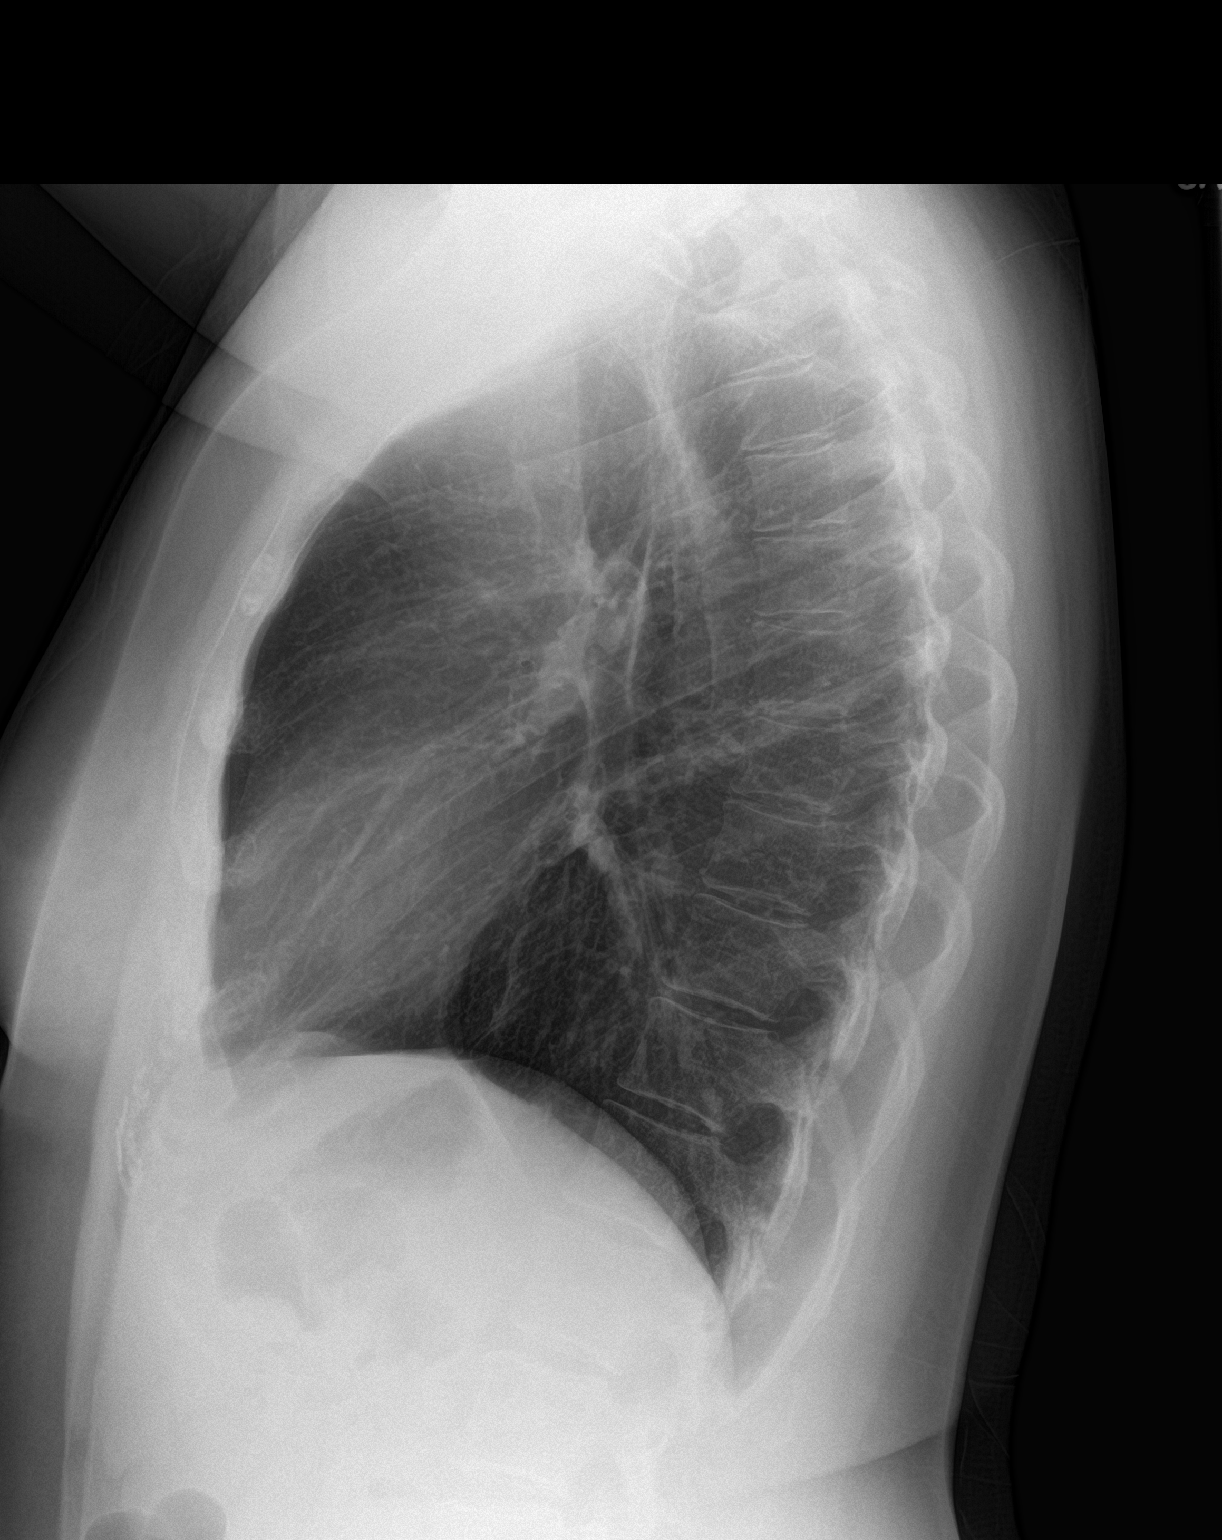

[2 of 2 positions shown; findings below may reference images not displayed]

FINDINGS: The heart size and mediastinal contours are within normal limits.
The lungs are clear. No pneumothorax or pleural effusion. The
visualized skeletal structures are unremarkable.
IMPRESSION: No acute cardiopulmonary process.

## 2021-03-06 MED FILL — Fluticasone Propionate Nasal Susp 50 MCG/ACT: NASAL | 30 days supply | Qty: 16 | Fill #2 | Status: AC

## 2021-03-07 ENCOUNTER — Other Ambulatory Visit: Payer: Self-pay

## 2021-03-25 ENCOUNTER — Encounter: Payer: Self-pay | Admitting: Internal Medicine

## 2021-03-28 NOTE — Addendum Note (Signed)
Addended by: Quentin Ore on: 03/28/2021 01:52 PM   Modules accepted: Orders

## 2021-03-28 NOTE — Telephone Encounter (Signed)
Patient last seen virtually 06/11/20. Does she need an appointment?

## 2021-04-06 ENCOUNTER — Other Ambulatory Visit: Payer: Self-pay

## 2021-04-06 ENCOUNTER — Telehealth: Payer: No Typology Code available for payment source | Admitting: Family

## 2021-04-06 DIAGNOSIS — B9689 Other specified bacterial agents as the cause of diseases classified elsewhere: Secondary | ICD-10-CM | POA: Diagnosis not present

## 2021-04-06 DIAGNOSIS — J208 Acute bronchitis due to other specified organisms: Secondary | ICD-10-CM

## 2021-04-06 MED ORDER — AZITHROMYCIN 250 MG PO TABS
ORAL_TABLET | ORAL | 0 refills | Status: DC
  Filled 2021-04-06: qty 6, 5d supply, fill #0

## 2021-04-06 MED ORDER — BENZONATATE 100 MG PO CAPS
100.0000 mg | ORAL_CAPSULE | Freq: Three times a day (TID) | ORAL | 0 refills | Status: DC | PRN
  Filled 2021-04-06: qty 20, 7d supply, fill #0

## 2021-04-06 MED FILL — Levocetirizine Dihydrochloride Tab 5 MG: ORAL | 90 days supply | Qty: 90 | Fill #2 | Status: AC

## 2021-04-06 MED FILL — Fluticasone Propionate Nasal Susp 50 MCG/ACT: NASAL | 30 days supply | Qty: 16 | Fill #3 | Status: AC

## 2021-04-06 NOTE — Progress Notes (Signed)
We are sorry that you are not feeling well.  Here is how we plan to help!  After reviewing your chart, it appears you have been treated several with antibiotics over the last 6 months. I highly recommend to make sure you are taking your allergy medications daily to prevent flare ups. If you are already doing this, I recommend following up with your PCP to discuss. Taking too many antibiotics can cause you to gain immunity to them.   Based on your presentation I believe you most likely have A cough due to bacteria.  When patients have a fever and a productive cough with a change in color or increased sputum production, we are concerned about bacterial bronchitis.  If left untreated it can progress to pneumonia.  If your symptoms do not improve with your treatment plan it is important that you contact your provider.   I have prescribed Azithromyin 250 mg: two tablets now and then one tablet daily for 4 additonal days    In addition you may use A non-prescription cough medication called Robitussin DAC. Take 2 teaspoons every 8 hours or Delsym: take 2 teaspoons every 12 hours., A non-prescription cough medication called Mucinex DM: take 2 tablets every 12 hours., and A prescription cough medication called Tessalon Perles 100mg . You may take 1-2 capsules every 8 hours as needed for your cough.  From your responses in the eVisit questionnaire you describe inflammation in the upper respiratory tract which is causing a significant cough.  This is commonly called Bronchitis and has four common causes:   Allergies Viral Infections Acid Reflux Bacterial Infection Allergies, viruses and acid reflux are treated by controlling symptoms or eliminating the cause. An example might be a cough caused by taking certain blood pressure medications. You stop the cough by changing the medication. Another example might be a cough caused by acid reflux. Controlling the reflux helps control the cough.  USE OF BRONCHODILATOR  ("RESCUE") INHALERS: There is a risk from using your bronchodilator too frequently.  The risk is that over-reliance on a medication which only relaxes the muscles surrounding the breathing tubes can reduce the effectiveness of medications prescribed to reduce swelling and congestion of the tubes themselves.  Although you feel brief relief from the bronchodilator inhaler, your asthma may actually be worsening with the tubes becoming more swollen and filled with mucus.  This can delay other crucial treatments, such as oral steroid medications. If you need to use a bronchodilator inhaler daily, several times per day, you should discuss this with your provider.  There are probably better treatments that could be used to keep your asthma under control.     HOME CARE Only take medications as instructed by your medical team. Complete the entire course of an antibiotic. Drink plenty of fluids and get plenty of rest. Avoid close contacts especially the very young and the elderly Cover your mouth if you cough or cough into your sleeve. Always remember to wash your hands A steam or ultrasonic humidifier can help congestion.   GET HELP RIGHT AWAY IF: You develop worsening fever. You become short of breath You cough up blood. Your symptoms persist after you have completed your treatment plan MAKE SURE YOU  Understand these instructions. Will watch your condition. Will get help right away if you are not doing well or get worse.    Thank you for choosing an e-visit.  Your e-visit answers were reviewed by a board certified advanced clinical practitioner to complete your personal  care plan. Depending upon the condition, your plan could have included both over the counter or prescription medications.  Please review your pharmacy choice. Make sure the pharmacy is open so you can pick up prescription now. If there is a problem, you may contact your provider through [employer] and have the prescription  routed to another pharmacy.  Your safety is important to [place]. If you have drug allergies check your prescription carefully.   For the next 24 hours you can use MyChart to ask questions about today's visit, request a non-urgent call back, or ask for a work or school excuse. You will get an email in the next two days asking about your experience. I hope that your e-visit has been valuable and will speed your recovery.  Approximately 5 minutes was spent documenting and reviewing patient's chart.

## 2021-04-27 ENCOUNTER — Other Ambulatory Visit: Payer: Self-pay

## 2021-04-27 MED ORDER — CARESTART COVID-19 HOME TEST VI KIT
PACK | 0 refills | Status: DC
  Filled 2021-04-27: qty 2, 4d supply, fill #0

## 2021-05-22 MED FILL — Etonogestrel-Ethinyl Estradiol VA Ring 0.12-0.015 MG/24HR: VAGINAL | 90 days supply | Qty: 3 | Fill #2 | Status: AC

## 2021-05-22 MED FILL — Fluticasone Propionate Nasal Susp 50 MCG/ACT: NASAL | 30 days supply | Qty: 16 | Fill #4 | Status: AC

## 2021-05-23 ENCOUNTER — Other Ambulatory Visit: Payer: Self-pay

## 2021-05-25 ENCOUNTER — Other Ambulatory Visit: Payer: Self-pay

## 2021-05-25 ENCOUNTER — Telehealth: Payer: No Typology Code available for payment source | Admitting: Nurse Practitioner

## 2021-05-25 DIAGNOSIS — H5789 Other specified disorders of eye and adnexa: Secondary | ICD-10-CM | POA: Diagnosis not present

## 2021-05-25 MED ORDER — POLYMYXIN B-TRIMETHOPRIM 10000-0.1 UNIT/ML-% OP SOLN
1.0000 [drp] | OPHTHALMIC | 0 refills | Status: DC
  Filled 2021-05-25: qty 10, 12d supply, fill #0

## 2021-05-25 NOTE — Progress Notes (Signed)

## 2021-05-27 MED ORDER — SULFAMETHOXAZOLE-TRIMETHOPRIM 800-160 MG PO TABS
1.0000 | ORAL_TABLET | Freq: Two times a day (BID) | ORAL | 0 refills | Status: DC
  Filled 2021-05-27: qty 20, 10d supply, fill #0

## 2021-05-27 MED ORDER — SULFAMETHOXAZOLE-TRIMETHOPRIM 800-160 MG PO TABS: 1.0000 | ORAL_TABLET | Freq: Two times a day (BID) | ORAL | 0 refills | Status: DC

## 2021-05-27 NOTE — Addendum Note (Signed)
Addended by: Chevis Pretty on: 05/27/2021 07:32 PM   Modules accepted: Orders

## 2021-05-27 NOTE — Addendum Note (Signed)
Addended by: Bennie Pierini on: 05/27/2021 07:36 PM   Modules accepted: Orders

## 2021-05-30 ENCOUNTER — Other Ambulatory Visit: Payer: Self-pay

## 2021-05-31 ENCOUNTER — Encounter: Payer: Self-pay | Admitting: Adult Health

## 2021-05-31 ENCOUNTER — Ambulatory Visit (INDEPENDENT_AMBULATORY_CARE_PROVIDER_SITE_OTHER): Payer: No Typology Code available for payment source | Admitting: Adult Health

## 2021-05-31 ENCOUNTER — Other Ambulatory Visit: Payer: Self-pay

## 2021-05-31 VITALS — BP 132/84 | HR 108 | Ht 60.0 in | Wt 166.8 lb

## 2021-05-31 DIAGNOSIS — J019 Acute sinusitis, unspecified: Secondary | ICD-10-CM

## 2021-05-31 DIAGNOSIS — B9689 Other specified bacterial agents as the cause of diseases classified elsewhere: Secondary | ICD-10-CM

## 2021-05-31 DIAGNOSIS — L249 Irritant contact dermatitis, unspecified cause: Secondary | ICD-10-CM

## 2021-05-31 MED ORDER — GUAIFENESIN-CODEINE 100-10 MG/5ML PO SYRP
5.0000 mL | ORAL_SOLUTION | Freq: Two times a day (BID) | ORAL | 0 refills | Status: DC | PRN
Start: 2021-05-31 — End: 2021-06-10
  Filled 2021-05-31: qty 75, 8d supply, fill #0

## 2021-05-31 MED ORDER — LEVOFLOXACIN 500 MG PO TABS
500.0000 mg | ORAL_TABLET | Freq: Every day | ORAL | 0 refills | Status: DC
  Filled 2021-05-31: qty 7, 7d supply, fill #0

## 2021-05-31 MED ORDER — PREDNISONE 10 MG PO TABS
ORAL_TABLET | ORAL | 0 refills | Status: DC
  Filled 2021-05-31: qty 21, 6d supply, fill #0

## 2021-05-31 MED ORDER — TRIAMCINOLONE ACETONIDE 0.5 % EX OINT
1.0000 "application " | TOPICAL_OINTMENT | Freq: Two times a day (BID) | CUTANEOUS | 0 refills | Status: DC
  Filled 2021-05-31: qty 30, 15d supply, fill #0

## 2021-05-31 NOTE — Progress Notes (Signed)
Acute Office Visit  Subjective:    Patient ID: Kelly Crawford, female    DOB: June 11, 1990, 31 y.o.   MRN: [id number]  Chief Complaint  Patient presents with   Facial Pain   Nasal Congestion   Cough    HPI Patient is in today for with facial pain, nasal congestion, and cough. She has had for over 2 weeks. Her son was recently sick.  She has tried multple over the counter medication.  Covid test was negative.  Sore throat from post nasal drip.  Coughing up yellow green mucous.Body aches.  Works at hospital.  Ear pressure and pain.  Lymphadenopathy she feels on neck is tender.   Patient  denies any fever,chills, rash, chest pain, shortness of breath, nausea, vomiting, or diarrhea.    Denies dizziness, lightheadedness, pre syncopal or syncopal episodes.    Seen on 05/25/20 for conjunctivitis. Treated with drops and then bactrim.   Bronchitis e -visit on 05/06/21  No LMP recorded. (Menstrual status: Other). LMP- within the last month - due any day. Denies pregnancy.  Tessalon pearles did not help.    Allergies  Allergen Reactions   Bee Venom Hives   Other Anaphylaxis    peas   Milk-Related Compounds     hives   Augmentin [Amoxicillin-Pot Clavulanate]     vomiting      Past Medical History:  Diagnosis Date   Allergy    bee venom, pollen, dust, mold and mildew h/o allergy shots at child   Blood type O+    Pregnancy    due 12/29/2018 1st child    Past Surgical History:  Procedure Laterality Date   ADENOIDECTOMY     CESAREAN SECTION N/A 12/08/2018   Procedure: CESAREAN SECTION;  Surgeon: Louretta Shorten, MD;  Location: [facility];  Service: Obstetrics;  Laterality: N/A;   tubes in ears     WISDOM TOOTH EXTRACTION      Family History  Problem Relation Age of Onset   Cancer Mother        breast dx'ed age 21   Hyperlipidemia Maternal Grandmother    Hypertension Maternal Grandmother    Diabetes Maternal Grandmother        pre   Cancer Maternal Grandfather         prostate   Hypertension Maternal Grandfather    Hyperlipidemia Maternal Grandfather    Diabetes Maternal Grandfather        pre   Hyperlipidemia Other     Social History   Socioeconomic History   Marital status: Married    Spouse name: Not on file   Number of children: Not on file   Years of education: Not on file   Highest education level: Not on file  Occupational History   Not on file  Tobacco Use   Smoking status: Never   Smokeless tobacco: Never  Substance and Sexual Activity   Alcohol use: Not Currently   Drug use: Not Currently   Sexual activity: Yes  Other Topics Concern   Not on file  Social History Narrative   Married    Expected baby girl 12/29/2018 [place]   Works [employer] in [place] dept    Likes spending time with family and shopping    From [place] [phone]   Social Determinants of Health   Financial Resource Strain: Not on file  Food Insecurity: Not on file  Transportation Needs: Not on file  Physical Activity: Not on file  Stress: Not on file  Social Connections: Not on file  Intimate Partner Violence: Not on file    Outpatient Medications Prior to Visit  Medication Sig Dispense Refill   albuterol (VENTOLIN HFA) 108 (90 Base) MCG/ACT inhaler INHALE 1-2 PUFFS INTO THE LUNGS EVERY 6 (SIX) HOURS AS NEEDED FOR WHEEZING OR SHORTNESS OF BREATH. 18 g 11   budesonide-formoterol (SYMBICORT) 80-4.5 MCG/ACT inhaler INHALE 2 PUFFS INTO THE LUNGS 2 (TWO) TIMES DAILY. RINSE MOUTH 10.2 g 11   etonogestrel-ethinyl estradiol (NUVARING) 0.12-0.015 MG/24HR vaginal ring INSERT 1 RING VAGINALLY EVERY MONTH AS DIRECTED 3 each 4   fluticasone (FLONASE) 50 MCG/ACT nasal spray PLACE 2 SPRAYS INTO BOTH NOSTRILS DAILY. 16 g 11   levocetirizine (XYZAL) 5 MG tablet TAKE 1 TABLET BY MOUTH EVERY EVENING. STOP ALLEGRA 90 tablet 3   sodium chloride (OCEAN) 0.65 % SOLN nasal spray Place 2 sprays into both nostrils as needed for congestion. 30 mL 11    trimethoprim-polymyxin b (POLYTRIM) ophthalmic solution Place 1 drop into both eyes every 4 (four) hours. 10 mL 0   Vitamin D, Cholecalciferol, 50 MCG (2000 UT) CAPS Take 1 capsule by mouth daily.     benzonatate (TESSALON PERLES) 100 MG capsule Take 1 capsule (100 mg total) by mouth 3 (three) times daily as needed. 20 capsule 0   COVID-19 At Home Antigen Test (CARESTART COVID-19 HOME TEST) KIT use as directed (Patient not taking: Reported on 05/31/2021) 2 kit 0   azithromycin (ZITHROMAX) 250 MG tablet Take 500 mg once, then 250 mg for four days (Patient not taking: Reported on 05/31/2021) 6 tablet 0   sulfamethoxazole-trimethoprim (BACTRIM DS) 800-160 MG tablet Take 1 tablet by mouth 2 (two) times daily. (Patient not taking: Reported on 05/31/2021) 20 tablet 0   No facility-administered medications prior to visit.    Allergies  Allergen Reactions   Bee Venom Hives   Other Anaphylaxis    peas   Milk-Related Compounds     hives   Augmentin [Amoxicillin-Pot Clavulanate]     vomiting     Review of Systems  Constitutional:  Positive for fatigue. Negative for activity change, appetite change, chills, diaphoresis, fever and unexpected weight change.  HENT:  Positive for congestion, ear pain, postnasal drip, rhinorrhea, sinus pressure and sinus pain.   Eyes: Negative.   Respiratory:  Positive for cough. Negative for apnea, choking, chest tightness, shortness of breath, wheezing and stridor.   Cardiovascular: Negative.   Gastrointestinal: Negative.   Genitourinary: Negative.   Musculoskeletal: Negative.       Objective:    Physical Exam Vitals reviewed.  Constitutional:      General: She is not in acute distress.    Appearance: She is well-developed. She is not ill-appearing, toxic-appearing or diaphoretic.  HENT:     Head: Normocephalic and atraumatic.     Right Ear: Hearing, ear canal and external ear normal. No tenderness. No middle ear effusion. Tympanic membrane is erythematous.  Tympanic membrane is not perforated.     Left Ear: Hearing, ear canal and external ear normal. No tenderness. A middle ear effusion is present. Tympanic membrane is erythematous. Tympanic membrane is not perforated.     Nose: Congestion and rhinorrhea present.     Right Sinus: Maxillary sinus tenderness and frontal sinus tenderness present.     Left Sinus: Maxillary sinus tenderness and frontal sinus tenderness present.     Mouth/Throat:     Mouth: No angioedema.     Pharynx: Oropharynx is clear. Uvula  midline. No oropharyngeal exudate or posterior oropharyngeal erythema.     Tonsils: No tonsillar exudate or tonsillar abscesses. 1+ on the right. 1+ on the left.  Eyes:     General: Lids are normal. No scleral icterus.       Right eye: No discharge.        Left eye: No discharge.     Conjunctiva/sclera: Conjunctivae normal.     Pupils: Pupils are equal, round, and reactive to light.  Neck:     Vascular: No JVD.     Trachea: Trachea normal. No tracheal deviation.     Meningeal: Brudzinski's sign absent.  Cardiovascular:     Rate and Rhythm: Normal rate and regular rhythm.     Heart sounds: Normal heart sounds. No murmur heard.   No friction rub. No gallop.  Pulmonary:     Effort: Pulmonary effort is normal. No respiratory distress.     Breath sounds: Normal breath sounds. No stridor. No wheezing or rales.  Chest:     Chest wall: No tenderness.  Abdominal:     General: Bowel sounds are normal.     Palpations: Abdomen is soft.  Musculoskeletal:        General: Normal range of motion.     Cervical back: Full passive range of motion without pain, normal range of motion and neck supple.  Lymphadenopathy:     Head:     Right side of head: No submental, submandibular, tonsillar, preauricular, posterior auricular or occipital adenopathy.     Left side of head: No submental, submandibular, tonsillar, preauricular, posterior auricular or occipital adenopathy.     Cervical: No cervical  adenopathy.  Skin:    General: Skin is warm and dry.     Capillary Refill: Capillary refill takes less than 2 seconds.     Coloration: Skin is not pale.     Findings: No erythema or rash.     Comments: Mildly erythematous rash on bilateral hands, improving she reports, she used some stain remover on daughters daycare clothes and hands became irritated improving. Mild itching ,no warmth or drainage. No wounds.   Neurological:     Mental Status: She is alert and oriented to person, place, and time.  Psychiatric:        Speech: Speech normal.        Behavior: Behavior normal.        Thought Content: Thought content normal.        Judgment: Judgment normal.    BP 132/84    Pulse (!) 108    Ht 5' (1.524 m)    Wt 166 lb 12.8 oz (75.7 kg)    SpO2 97%    BMI 32.58 kg/m  Wt Readings from Last 3 Encounters:  05/31/21 166 lb 12.8 oz (75.7 kg)  06/11/20 158 lb (71.7 kg)  11/28/19 152 lb (68.9 kg)    Health Maintenance Due  Topic Date Due   COVID-19 Vaccine (1) Never done   INFLUENZA VACCINE  12/13/2020   PAP-Cervical Cytology Screening  06/07/2021   PAP SMEAR-Modifier  06/07/2021    There are no preventive care reminders to display for this patient.   Lab Results  Component Value Date   TSH 2.06 11/22/2018   Lab Results  Component Value Date   WBC 3.9 (L) 07/07/2019   HGB 14.1 07/07/2019   HCT 42.3 07/07/2019   MCV 95.7 07/07/2019   PLT 225 07/07/2019   Lab Results  Component Value Date   NA  141 07/07/2019   K 4.2 07/07/2019   CO2 25 07/07/2019   GLUCOSE 95 07/07/2019   BUN 18 07/07/2019   CREATININE 0.78 07/07/2019   BILITOT 0.7 11/22/2018   ALKPHOS 108 11/22/2018   AST 15 11/22/2018   ALT 17 11/22/2018   PROT 6.1 11/22/2018   ALBUMIN 3.5 11/22/2018   CALCIUM 9.4 07/07/2019   ANIONGAP 9 07/07/2019   GFR 105.56 11/22/2018   Lab Results  Component Value Date   CHOL 202 (H) 07/07/2019   Lab Results  Component Value Date   HDL 54 07/07/2019   Lab Results   Component Value Date   LDLCALC 135 (H) 07/07/2019   Lab Results  Component Value Date   TRIG 64 07/07/2019   Lab Results  Component Value Date   CHOLHDL 3.7 07/07/2019   No results found for: HGBA1C     Assessment & Plan:   Problem List Items Addressed This Visit       Respiratory   Acute bacterial sinusitis - Primary   Relevant Medications   levofloxacin (LEVAQUIN) 500 MG tablet   predniSONE (DELTASONE) 10 MG tablet   guaiFENesin-codeine (ROBITUSSIN AC) 100-10 MG/5ML syrup     Musculoskeletal and Integument   Irritant contact dermatitis   Relevant Medications   triamcinolone ointment (KENALOG) 0.5 %    Declined chest x ray and CBC today if any persistent symptoms she will return.   Follow treatment plan from office  if not improving or any worsening within 72 hours and also return to office or open medical facility at ANYTIME if any symptoms persist, change, or worsen or you have any further concerns or questions. Call 911 immediately for emergencies.   Meds ordered this encounter  Medications   levofloxacin (LEVAQUIN) 500 MG tablet    Sig: Take 1 tablet (500 mg total) by mouth daily.    Dispense:  7 tablet    Refill:  0   predniSONE (DELTASONE) 10 MG tablet    Sig: Take 6 tabs on day 1, 5 tabs on day 2, 4 tabs on day 3, 3 tabs on day 4, 2 tabs on day 5, 1 tab on day 6. Then stop.    Dispense:  21 tablet    Refill:  0   triamcinolone ointment (KENALOG) 0.5 %    Sig: Apply 1 application topically 2 (two) times daily.    Dispense:  30 g    Refill:  0   guaiFENesin-codeine (ROBITUSSIN AC) 100-10 MG/5ML syrup    Sig: Take 5 mLs by mouth every 12 (twelve) hours as needed for cough (will cause drowsiness).    Dispense:  75 mL    Refill:  0  Probiotics advised for 3 weeks. Discussed possibility of C difficile and tendon rupture.  Stop bactrim.   Allergies  Allergen Reactions   Bee Venom Hives   Other Anaphylaxis    peas   Milk-Related Compounds     hives    Augmentin [Amoxicillin-Pot Clavulanate]     vomiting      Red Flags discussed. The patient was given clear instructions to go to ER or return to medical center if any red flags develop, symptoms do not improve, worsen or new problems develop. They verbalized understanding.  Return in about 1 week (around 06/07/2021), or if symptoms worsen or fail to improve, for at any time for any worsening symptoms, Go to Emergency room/ urgent care if worse.   Marcille Buffy, FNP

## 2021-05-31 NOTE — Patient Instructions (Signed)
Contact Dermatitis Dermatitis is redness, soreness, and swelling (inflammation) of the skin. Contact dermatitis is a reaction to something that touches the skin. There are two types of contact dermatitis: Irritant contact dermatitis. This happens when something bothers (irritates) your skin, like soap. Allergic contact dermatitis. This is caused when you are exposed to something that you are allergic to, such as poison ivy. What are the causes? Common causes of irritant contact dermatitis include: Makeup. Soaps. Detergents. Bleaches. Acids. Metals, such as nickel. Common causes of allergic contact dermatitis include: Plants. Chemicals. Jewelry. Latex. Medicines. Preservatives in products, such as clothing. What increases the risk? Having a job that exposes you to things that bother your skin. Having asthma or eczema. What are the signs or symptoms? Symptoms may happen anywhere the irritant has touched your skin. Symptoms include: Dry or flaky skin. Redness. Cracks. Itching. Pain or a burning feeling. Blisters. Blood or clear fluid draining from skin cracks. With allergic contact dermatitis, swelling may occur. This may happen in places such as the eyelids, mouth, or genitals. How is this treated? This condition is treated by checking for the cause of the reaction and protecting your skin. Treatment may also include: Steroid creams, ointments, or medicines. Antibiotic medicines or other ointments, if you have a skin infection. Lotion or medicines to help with itching. A bandage (dressing). Follow these instructions at home: Skin care Moisturize your skin as needed. Put cool cloths on your skin. Put a baking soda paste on your skin. Stir water into baking soda until it looks like a paste. Do not scratch your skin. Avoid having things rub up against your skin. Avoid the use of soaps, perfumes, and dyes. Medicines Take or apply over-the-counter and prescription medicines  only as told by your doctor. If you were prescribed an antibiotic medicine, take or apply it as told by your doctor. Do not stop using it even if your condition starts to get better. Bathing Take a bath with: Epsom salts. Baking soda. Colloidal oatmeal. Bathe less often. Bathe in warm water. Avoid using hot water. Bandage care If you were given a bandage, change it as told by your health care provider. Wash your hands with soap and water before and after you change your bandage. If soap and water are not available, use hand sanitizer. General instructions Avoid the things that caused your reaction. If you do not know what caused it, keep a journal. Write down: What you eat. What skin products you use. What you drink. What you wear in the area that has symptoms. This includes jewelry. Check the affected areas every day for signs of infection. Check for: More redness, swelling, or pain. More fluid or blood. Warmth. Pus or a bad smell. Keep all follow-up visits as told by your doctor. This is important. Contact a doctor if: You do not get better with treatment. Your condition gets worse. You have signs of infection, such as: More swelling. Tenderness. More redness. Soreness. Warmth. You have a fever. You have new symptoms. Get help right away if: You have a very bad headache. You have neck pain. Your neck is stiff. You throw up (vomit). You feel very sleepy. You see red streaks coming from the area. Your bone or joint near the area hurts after the skin has healed. The area turns darker. You have trouble breathing. Summary Dermatitis is redness, soreness, and swelling of the skin. Symptoms may occur where the irritant has touched you. Treatment may include medicines and skin care. If you  do not know what caused your reaction, keep a journal. Contact a doctor if your condition gets worse or you have signs of infection. This information is not intended to replace advice  given to you by your health care provider. Make sure you discuss any questions you have with your health care provider. Document Revised: 08/21/2018 Document Reviewed: 11/14/2017 Elsevier Patient Education  [place]. Sinusitis, Adult Sinusitis is inflammation of your sinuses. Sinuses are hollow spaces in the bones around your face. Your sinuses are located: Around your eyes. In the middle of your forehead. Behind your nose. In your cheekbones. Mucus normally drains out of your sinuses. When your nasal tissues become inflamed or swollen, mucus can become trapped or blocked. This allows bacteria, viruses, and fungi to grow, which leads to infection. Most infections of the sinuses are caused by a virus. Sinusitis can develop quickly. It can last for up to 4 weeks (acute) or for more than 12 weeks (chronic). Sinusitis often develops after a cold. What are the causes? This condition is caused by anything that creates swelling in the sinuses or stops mucus from draining. This includes: Allergies. Asthma. Infection from bacteria or viruses. Deformities or blockages in your nose or sinuses. Abnormal growths in the nose (nasal polyps). Pollutants, such as chemicals or irritants in the air. Infection from fungi (rare). What increases the risk? You are more likely to develop this condition if you: Have a weak body defense system (immune system). Do a lot of swimming or diving. Overuse nasal sprays. Smoke. What are the signs or symptoms? The main symptoms of this condition are pain and a feeling of pressure around the affected sinuses. Other symptoms include: Stuffy nose or congestion. Thick drainage from your nose. Swelling and warmth over the affected sinuses. Headache. Upper toothache. A cough that may get worse at night. Extra mucus that collects in the throat or the back of the nose (postnasal drip). Decreased sense of smell and taste. Fatigue. A fever. Sore throat. Bad  breath. How is this diagnosed? This condition is diagnosed based on: Your symptoms. Your medical history. A physical exam. Tests to find out if your condition is acute or chronic. This may include: Checking your nose for nasal polyps. Viewing your sinuses using a device that has a light (endoscope). Testing for allergies or bacteria. Imaging tests, such as an MRI or CT scan. In rare cases, a bone biopsy may be done to rule out more serious types of fungal sinus disease. How is this treated? Treatment for sinusitis depends on the cause and whether your condition is chronic or acute. If caused by a virus, your symptoms should go away on their own within 10 days. You may be given medicines to relieve symptoms. They include: Medicines that shrink swollen nasal passages (topical intranasal decongestants). Medicines that treat allergies (antihistamines). A spray that eases inflammation of the nostrils (topical intranasal corticosteroids). Rinses that help get rid of thick mucus in your nose (nasal saline washes). If caused by bacteria, your health care provider may recommend waiting to see if your symptoms improve. Most bacterial infections will get better without antibiotic medicine. You may be given antibiotics if you have: A severe infection. A weak immune system. If caused by narrow nasal passages or nasal polyps, you may need to have surgery. Follow these instructions at home: Medicines Take, use, or apply over-the-counter and prescription medicines only as told by your health care provider. These may include nasal sprays. If you were prescribed an  antibiotic medicine, take it as told by your health care provider. Do not stop taking the antibiotic even if you start to feel better. Hydrate and humidify  Drink enough fluid to keep your urine pale yellow. Staying hydrated will help to thin your mucus. Use a cool mist humidifier to keep the humidity level in your home above 50%. Inhale  steam for 10-15 minutes, 3-4 times a day, or as told by your health care provider. You can do this in the bathroom while a hot shower is running. Limit your exposure to cool or dry air. Rest Rest as much as possible. Sleep with your head raised (elevated). Make sure you get enough sleep each night. General instructions  Apply a warm, moist washcloth to your face 3-4 times a day or as told by your health care provider. This will help with discomfort. Wash your hands often with soap and water to reduce your exposure to germs. If soap and water are not available, use hand sanitizer. Do not smoke. Avoid being around people who are smoking (secondhand smoke). Keep all follow-up visits as told by your health care provider. This is important. Contact a health care provider if: You have a fever. Your symptoms get worse. Your symptoms do not improve within 10 days. Get help right away if: You have a severe headache. You have persistent vomiting. You have severe pain or swelling around your face or eyes. You have vision problems. You develop confusion. Your neck is stiff. You have trouble breathing. Summary Sinusitis is soreness and inflammation of your sinuses. Sinuses are hollow spaces in the bones around your face. This condition is caused by nasal tissues that become inflamed or swollen. The swelling traps or blocks the flow of mucus. This allows bacteria, viruses, and fungi to grow, which leads to infection. If you were prescribed an antibiotic medicine, take it as told by your health care provider. Do not stop taking the antibiotic even if you start to feel better. Keep all follow-up visits as told by your health care provider. This is important. This information is not intended to replace advice given to you by your health care provider. Make sure you discuss any questions you have with your health care provider. Document Revised: 10/01/2017 Document Reviewed: 10/01/2017 Elsevier Patient  Education  [place]. Prednisolone Tablets What is this medication? PREDNISOLONE (pred NISS oh lone) treats many conditions such as asthma, allergic reactions, arthritis, inflammatory bowel diseases, adrenal, and blood or bone marrow disorders. It works by decreasing inflammation, slowing down an overactive immune system, or replacing cortisol normally made in the body. Cortisol is a hormone that plays an important role in how the body responds to stress, illness, and injury. It belongs to a group of medications called steroids. This medicine may be used for other purposes; ask your health care provider or pharmacist if you have questions. COMMON BRAND NAME(S): Millipred, Millipred DP, Millipred DP 12-Day, Millipred DP 6 Day, Prednoral What should I tell my care team before I take this medication? They need to know if you have any of these conditions: Cushing's syndrome Diabetes Glaucoma Heart problems or disease High blood pressure Infection such as herpes, measles, tuberculosis, or chickenpox Kidney disease Liver disease Mental problems Myasthenia gravis Osteoporosis Seizures Stomach ulcer or intestine disease including colitis and diverticulitis Thyroid problem An unusual or allergic reaction to lactose, prednisolone, other medications, foods, dyes, or preservatives Pregnant or trying to get pregnant Breast-feeding How should I use this medication? Take this medication  by mouth with a glass of water. Follow the directions on the prescription label. Take it with food or milk to avoid stomach upset. If you are taking this medication once a day, take it in the morning. Do not take more medication than you are told to take. Do not suddenly stop taking your medication because you may develop a severe reaction. Your care team will tell you how much medication to take. If your care team wants you to stop the medication, the dose may be slowly lowered over time to avoid any side  effects. Talk to your care team about the use of this medication in children. Special care may be needed. Overdosage: If you think you have taken too much of this medicine contact a poison control center or emergency room at once. NOTE: This medicine is only for you. Do not share this medicine with others. What if I miss a dose? If you miss a dose, take it as soon as you can. If it is almost time for your next dose, take only that dose. Do not take double or extra doses. What may interact with this medication? Do not take this medication with any of the following: Metyrapone Mifepristone This medication may also interact with the following: Aminoglutethimide Amphotericin B Aspirin and aspirin-like medications Barbiturates Certain medications for diabetes, like glipizide or glyburide Cholestyramine Cholinesterase inhibitors Cyclosporine Digoxin Diuretics Ephedrine Female hormones, like estrogens and birth control pills Isoniazid Ketoconazole NSAIDS, medications for pain and inflammation, like ibuprofen or naproxen Phenytoin Rifampin Toxoids Vaccines Warfarin This list may not describe all possible interactions. Give your health care provider a list of all the medicines, herbs, non-prescription drugs, or dietary supplements you use. Also tell them if you smoke, drink alcohol, or use illegal drugs. Some items may interact with your medicine. What should I watch for while using this medication? Visit your care team for regular checks on your progress. If you are taking this medication over a prolonged period, carry an identification card with your name and address, the type and dose of your medication, and your care team's name and address. This medication may increase your risk of getting an infection. Tell your care team if you are around anyone with measles or chickenpox, or if you develop sores or blisters that do not heal properly. If you are going to have surgery, tell your care  team that you have taken this medication within the last twelve months. Ask your care team about your diet. You may need to lower the amount of salt you eat. This medication may increase blood sugar. Ask your care team if changes in diet or medications are needed if you have diabetes. What side effects may I notice from receiving this medication? Side effects that you should report to your care team as soon as possible: Allergic reactions--skin rash, itching, hives, swelling of the face, lips, tongue, or throat Cushing syndrome--increased fat around the midsection, upper back, neck, or face, pink or purple stretch marks on the skin, thinning, fragile skin that easily bruises, unexpected hair growth High blood sugar (hyperglycemia)--increased thirst or amount of urine, unusual weakness or fatigue, blurry vision Increase in blood pressure Infection--fever, chills, cough, sore throat, wounds that don't heal, pain or trouble when passing urine, general feeling of discomfort or being unwell Low adrenal gland function--nausea, vomiting, loss of appetite, unusual weakness or fatigue, dizziness Mood and behavior changes--anxiety, nervousness, confusion, hallucinations, irritability, hostility, thoughts of suicide or self-harm, worsening mood, feelings of depression Stomach bleeding--bloody  or black, tar-like stools, vomiting blood or brown material that looks like coffee grounds Swelling of the ankles, hands, or feet Side effects that usually do not require medical attention (report to your care team if they continue or are bothersome): Acne General discomfort and fatigue Headache Increase in appetite Nausea Trouble sleeping Weight gain This list may not describe all possible side effects. Call your doctor for medical advice about side effects. You may report side effects to FDA at 1-800-FDA-1088. Where should I keep my medication? Keep out of the reach of children. Store at room temperature between  15 and 30 degrees C (59 and 86 degrees F). Keep container tightly closed. Throw away any unused medication after the expiration date. NOTE: This sheet is a summary. It may not cover all possible information. If you have questions about this medicine, talk to your doctor, pharmacist, or health care provider.  2022 Elsevier/Gold Standard (2020-07-30 00:00:00) Levofloxacin Tablets What is this medication? LEVOFLOXACIN (lee voe FLOX a sin) treats infections caused by bacteria. It belongs to a group of medications called quinolone antibiotics. It will not treat colds, the flu, or infections caused by viruses. This medicine may be used for other purposes; ask your health care provider or pharmacist if you have questions. COMMON BRAND NAME(S): Levaquin, Levaquin Leva-Pak What should I tell my care team before I take this medication? They need to know if you have any of these conditions: Bone problems Diabetes Heart disease High blood pressure History of irregular heartbeat History of low levels of potassium in the blood Joint problems Kidney disease Liver disease Mental illness Myasthenia gravis Seizures Tendon problems Tingling of the fingers or toes, or other nerve disorder An unusual or allergic reaction to levofloxacin, other quinolone antibiotics, foods, dyes, or preservatives Pregnant or trying to get pregnant Breast-feeding How should I use this medication? Take this medication by mouth with a full glass of water. Follow the directions on the prescription label. You can take it with or without food. If it upsets your stomach, take it with food. Take your medication at regular intervals. Do not take your medication more often than directed. Take all of your medication as directed even if you think you are better. Do not skip doses or stop your medication early. Avoid antacids, calcium, iron, and zinc products for 2 hours before and 2 hours after taking a dose of this medication. A  special MedGuide will be given to you by the pharmacist with each prescription and refill. Be sure to read this information carefully each time. Talk to your care team about the use of this medication in children. While this medication may be prescribed for children as young as 6 months for selected conditions, precautions do apply. Overdosage: If you think you have taken too much of this medicine contact a poison control center or emergency room at once. NOTE: This medicine is only for you. Do not share this medicine with others. What if I miss a dose? If you miss a dose, take it as soon as you remember. If it is almost time for your next dose, take only that dose. Do not take double or extra doses. What may interact with this medication? Do not take this medication with any of the following: Cisapride Dronedarone Pimozide Thioridazine This medication may also interact with the following: Antacids Birth control pills Certain medications for diabetes, like glipizide, glyburide, or insulin Certain medications that treat or prevent blood clots like warfarin Didanosine buffered tablets or powder Multivitamins  NSAIDS, medications for pain and inflammation, like ibuprofen or naproxen Other medications that prolong the QT interval (cause an abnormal heart rhythm) like dofetilide, ziprasidone Steroid medications like prednisone or cortisone Sucralfate Theophylline This list may not describe all possible interactions. Give your health care provider a list of all the medicines, herbs, non-prescription drugs, or dietary supplements you use. Also tell them if you smoke, drink alcohol, or use illegal drugs. Some items may interact with your medicine. What should I watch for while using this medication? Tell your care team if your symptoms do not start to get better or if they get worse. Do not treat diarrhea with over the counter products. Contact your care team if you have diarrhea that lasts more  than 2 days or if it is severe and watery. Check with your care team if you get an attack of severe diarrhea, nausea and vomiting, or if you sweat a lot. The loss of too much body fluid can make it dangerous for you to take this medication. This medication may increase blood sugar. Ask your care team if changes in diet or medications are needed if you have diabetes. You may get drowsy or dizzy. Do not drive, use machinery, or do anything that needs mental alertness until you know how this medication affects you. Do not sit or stand up quickly, especially if you are an older patient. This reduces the risk of dizzy or fainting spells. This medication can make you more sensitive to the sun. Keep out of the sun. If you cannot avoid being in the sun, wear protective clothing and use sunscreen. Do not use sun lamps or tanning beds/booths. What side effects may I notice from receiving this medication? Side effects that you should report to your care team as soon as possible: Allergic reactions--skin rash, itching, hives, swelling of the face, lips, tongue, or throat Heart rhythm changes--fast or irregular heartbeat, dizziness, feeling faint or lightheaded, chest pain, trouble breathing Increased pressure around the brain--severe headache, blurry vision, change in vision, nausea, vomiting Joint, muscle, or tendon pain, swelling, or stiffness Liver injury--right upper belly pain, loss of appetite, nausea, light-colored stool, dark yellow or brown urine, yellowing skin or eyes, unusual weakness or fatigue Mood and behavior changes--anxiety, nervousness, confusion, hallucinations, irritability, hostility, thoughts of suicide or self-harm, worsening mood, feelings of depression Pain, tingling, or numbness in the hands or feet Redness, blistering, peeling, or loosening of the skin, including inside the mouth Seizures Severe diarrhea, fever Sudden or severe chest, back, or stomach pain Unusual vaginal discharge,  itching, or odor Side effects that usually do not require medical attention (report to your care team if they continue or are bothersome): Diarrhea Dizziness Headache Nausea Sensitivity to light Trouble sleeping This list may not describe all possible side effects. Call your doctor for medical advice about side effects. You may report side effects to FDA at 1-800-FDA-1088. Where should I keep my medication? Keep out of the reach of children. Store at room temperature between 15 and 30 degrees C (59 and 86 degrees F). Keep in a tightly closed container. Throw away any unused medication after the expiration date. NOTE: This sheet is a summary. It may not cover all possible information. If you have questions about this medicine, talk to your doctor, pharmacist, or health care provider.  2022 Elsevier/Gold Standard (2020-07-09 00:00:00)

## 2021-06-01 ENCOUNTER — Encounter: Payer: Self-pay | Admitting: Adult Health

## 2021-06-01 DIAGNOSIS — L249 Irritant contact dermatitis, unspecified cause: Secondary | ICD-10-CM | POA: Insufficient documentation

## 2021-06-01 DIAGNOSIS — B9689 Other specified bacterial agents as the cause of diseases classified elsewhere: Secondary | ICD-10-CM | POA: Insufficient documentation

## 2021-06-10 ENCOUNTER — Other Ambulatory Visit: Payer: Self-pay

## 2021-06-10 ENCOUNTER — Encounter: Payer: Self-pay | Admitting: Internal Medicine

## 2021-06-10 ENCOUNTER — Ambulatory Visit (INDEPENDENT_AMBULATORY_CARE_PROVIDER_SITE_OTHER): Payer: No Typology Code available for payment source | Admitting: Internal Medicine

## 2021-06-10 VITALS — BP 116/60 | HR 96 | Temp 98.5°F | Ht 60.0 in | Wt 168.2 lb

## 2021-06-10 DIAGNOSIS — Z1329 Encounter for screening for other suspected endocrine disorder: Secondary | ICD-10-CM | POA: Diagnosis not present

## 2021-06-10 DIAGNOSIS — E559 Vitamin D deficiency, unspecified: Secondary | ICD-10-CM

## 2021-06-10 DIAGNOSIS — Z1389 Encounter for screening for other disorder: Secondary | ICD-10-CM

## 2021-06-10 DIAGNOSIS — Z0000 Encounter for general adult medical examination without abnormal findings: Secondary | ICD-10-CM | POA: Diagnosis not present

## 2021-06-10 DIAGNOSIS — Z13220 Encounter for screening for lipoid disorders: Secondary | ICD-10-CM | POA: Diagnosis not present

## 2021-06-10 DIAGNOSIS — Z8269 Family history of other diseases of the musculoskeletal system and connective tissue: Secondary | ICD-10-CM

## 2021-06-10 DIAGNOSIS — E2839 Other primary ovarian failure: Secondary | ICD-10-CM

## 2021-06-10 NOTE — Patient Instructions (Signed)
Hand eczema  Eczema Eczema refers to a group of skin conditions that cause skin to become rough and inflamed. Each type of eczema has different triggers, symptoms, and treatments. Eczema of any type is usually itchy. Symptoms range from mild to severe. Eczema is not spread from person to person (is not contagious). It can appear on different parts of the body at different times. One person's eczema may look different from another person's eczema. What are the causes? The exact cause of this condition is not known. However, exposure to certain environmental factors, irritants, and allergens can make the condition worse. What are the signs or symptoms? Symptoms of this condition depend on the type of eczema you have. The types include: Contact dermatitis. There are two kinds: Irritant contact dermatitis. This happens when something irritates the skin and causes a rash. Allergic contact dermatitis. This happens when your skin comes in contact with something you are allergic to (allergens). This can include poison ivy, chemicals, or medicines that were applied to your skin. Atopic dermatitis. This is a long-term (chronic) skin disease that keeps coming back (recurring). It is the most common type of eczema. Usual symptoms are a red rash and itchy, dry, scaly skin. It usually starts showing signs in infancy and can last through adulthood. Dyshidrotic eczema. This is a form of eczema on the hands and feet. It shows up as very itchy, fluid-filled blisters. It can affect people of any age but is more common before age 66. Hand eczema. This causes very itchy areas of skin on the palms and sides of the hands and fingers. This type of eczema is common in industrial jobs where you may be exposed to different types of irritants. Lichen simplex chronicus. This type of eczema occurs when a person constantly scratches one area of the body. Repeated scratching of the area leads to thickened skin (lichenification). This  condition can accompany other types of eczema. It is more common in adults but may also be seen in children. Nummular eczema. This is a common type of eczema that most often affects the lower legs and the backs of the hands. It typically causes an itchy, red, circular, crusty lesion (plaque). Scratching may become a habit and can cause bleeding. Nummular eczema occurs most often in middle-aged or older people. Seborrheic dermatitis. This is a common skin disease that mainly affects the scalp. It may also affect other oily areas of the body, such as the face, sides of the nose, eyebrows, ears, eyelids, and chest. It is marked by small scaling and redness of the skin (erythema). This can affect people of all ages. In infants, this condition is called cradle cap. Stasis dermatitis. This is a common skin disease that can cause itching, scaling, and hyperpigmentation, usually on the legs and feet. It occurs most often in people who have a condition that prevents blood from being pumped through the veins in the legs (chronic venous insufficiency). Stasis dermatitis is a chronic condition that needs long-term management. How is this diagnosed? This condition may be diagnosed based on: A physical exam of your skin. Your medical history. Skin patch tests. These tests involve using patches that contain possible allergens and placing them on your back. Your health care provider will check in a few days to see if an allergic reaction occurred. How is this treated? Treatment for eczema is based on the type of eczema you have. You may be given hydrocortisone steroid medicine or antihistamines. These can relieve itching quickly and  help reduce inflammation. These may be prescribed or purchased over the counter, depending on the strength that is needed. Follow these instructions at home: Take or apply over-the-counter and prescription medicines only as told by your health care provider. Use creams or ointments to  moisturize your skin. Do not use lotions. Learn what triggers or irritates your symptoms so you can avoid these things. Treat symptom flare-ups quickly. Do not scratch your skin. This can make your rash worse. Keep all follow-up visits. This is important. Where to find more information American Academy of Dermatology: MarketingSheets.si National Eczema Association: nationaleczema.org The Society for Pediatric Dermatology: pedsderm.net Contact a health care provider if: You have severe itching, even with treatment. You scratch your skin regularly until it bleeds. Your rash looks different than usual. Your skin is painful, swollen, or more red than usual. You have a fever. Summary Eczema refers to a group of skin conditions that cause skin to become rough and inflamed. Each type has different triggers. Eczema of any type causes itching that may range from mild to severe. Treatment varies based on the type of eczema you have. Hydrocortisone steroid medicine or antihistamines can help with itching and inflammation. Protecting your skin is the best way to prevent eczema. Use creams or ointments to moisturize your skin. Avoid triggers and irritants. Treat flare-ups quickly. This information is not intended to replace advice given to you by your health care provider. Make sure you discuss any questions you have with your health care provider. Document Revised: 02/09/2020 Document Reviewed: 02/09/2020 Elsevier Patient Education  2022 [employer].

## 2021-06-10 NOTE — Progress Notes (Signed)
Chief Complaint  Patient presents with   Follow-up   Annual with daughter doing well since sinusitis and txed with levaquin and prednisone  Fh osteopenia/porosis mom had in 20s-30s will order bone density    Review of Systems  Constitutional:  Negative for weight loss.  HENT:  Negative for hearing loss.   Eyes:  Negative for blurred vision.  Respiratory:  Negative for shortness of breath.   Cardiovascular:  Negative for chest pain.  Gastrointestinal:  Negative for abdominal pain and blood in stool.  Genitourinary:  Negative for dysuria.  Musculoskeletal:  Negative for falls and joint pain.  Skin:  Negative for rash.  Neurological:  Negative for headaches.  Psychiatric/Behavioral:  Negative for depression.   Past Medical History:  Diagnosis Date   Allergy    bee venom, pollen, dust, mold and mildew h/o allergy shots at child   Blood type O+    Pregnancy    due 12/29/2018 1st child   Past Surgical History:  Procedure Laterality Date   ADENOIDECTOMY     CESAREAN SECTION N/A 12/08/2018   Procedure: CESAREAN SECTION;  Surgeon: Louretta Shorten, MD;  Location: [facility];  Service: Obstetrics;  Laterality: N/A;   tubes in ears     WISDOM TOOTH EXTRACTION     Family History  Problem Relation Age of Onset   Cancer Mother        breast dx'ed age 50   Osteopenia Mother        40s-30s   Hyperlipidemia Maternal Grandmother    Hypertension Maternal Grandmother    Diabetes Maternal Grandmother        pre   Cancer Maternal Grandfather        prostate   Hypertension Maternal Grandfather    Hyperlipidemia Maternal Grandfather    Diabetes Maternal Grandfather        pre   Hyperlipidemia Other    Social History   Socioeconomic History   Marital status: Married    Spouse name: Not on file   Number of children: Not on file   Years of education: Not on file   Highest education level: Not on file  Occupational History   Not on file  Tobacco Use   Smoking status: Never   Smokeless  tobacco: Never  Substance and Sexual Activity   Alcohol use: Not Currently   Drug use: Not Currently   Sexual activity: Yes  Other Topics Concern   Not on file  Social History Narrative   Married    Expected baby girl 12/29/2018 [place]   Works [employer] in [place] dept    Likes spending time with family and shopping    From [place] [phone]   Social Determinants of Health   Financial Resource Strain: Not on file  Food Insecurity: Not on file  Transportation Needs: Not on file  Physical Activity: Not on file  Stress: Not on file  Social Connections: Not on file  Intimate Partner Violence: Not on file   Current Meds  Medication Sig   etonogestrel-ethinyl estradiol (NUVARING) 0.12-0.015 MG/24HR vaginal ring INSERT 1 RING VAGINALLY EVERY MONTH AS DIRECTED   fluticasone (FLONASE) 50 MCG/ACT nasal spray PLACE 2 SPRAYS INTO BOTH NOSTRILS DAILY.   levocetirizine (XYZAL) 5 MG tablet TAKE 1 TABLET BY MOUTH EVERY EVENING. STOP ALLEGRA   sodium chloride (OCEAN) 0.65 % SOLN nasal spray Place 2 sprays into both nostrils as needed for congestion.   Vitamin D, Cholecalciferol, 50 MCG (2000  UT) CAPS Take 1 capsule by mouth daily.   Allergies  Allergen Reactions   Bee Venom Hives   Other Anaphylaxis    peas   Milk-Related Compounds     hives   Augmentin [Amoxicillin-Pot Clavulanate]     vomiting    No results found for this or any previous visit (from the past 2160 hour(s)). Objective  Body mass index is 32.85 kg/m. Wt Readings from Last 3 Encounters:  06/10/21 168 lb 3.2 oz (76.3 kg)  05/31/21 166 lb 12.8 oz (75.7 kg)  06/11/20 158 lb (71.7 kg)   Temp Readings from Last 3 Encounters:  06/10/21 98.5 F (36.9 C) (Oral)  11/03/19 98.8 F (37.1 C) (Oral)  09/24/19 98 F (36.7 C) (Temporal)   BP Readings from Last 3 Encounters:  06/10/21 116/60  05/31/21 132/84  11/03/19 116/78   Pulse Readings from Last 3 Encounters:  06/10/21 96  05/31/21 (!) 108   11/03/19 (!) 105    Physical Exam Vitals and nursing note reviewed.  Constitutional:      Appearance: Normal appearance. She is well-developed and well-groomed.  HENT:     Head: Normocephalic and atraumatic.  Eyes:     Conjunctiva/sclera: Conjunctivae normal.     Pupils: Pupils are equal, round, and reactive to light.  Cardiovascular:     Rate and Rhythm: Normal rate and regular rhythm.     Heart sounds: Normal heart sounds. No murmur heard. Pulmonary:     Effort: Pulmonary effort is normal.     Breath sounds: Normal breath sounds.  Abdominal:     General: Abdomen is flat. Bowel sounds are normal.     Tenderness: There is no abdominal tenderness.  Musculoskeletal:        General: No tenderness.  Skin:    General: Skin is warm and dry.  Neurological:     General: No focal deficit present.     Mental Status: She is alert and oriented to person, place, and time. Mental status is at baseline.     Cranial Nerves: Cranial nerves 2-12 are intact.     Gait: Gait is intact.  Psychiatric:        Attention and Perception: Attention and perception normal.        Mood and Affect: Mood and affect normal.        Speech: Speech normal.        Behavior: Behavior normal. Behavior is cooperative.        Thought Content: Thought content normal.        Cognition and Memory: Cognition and memory normal.        Judgment: Judgment normal.    Assessment  Plan  Annual physical exam - Plan: Comprehensive metabolic panel, Lipid panel, CBC with Differential/Platelet, TSH, Urinalysis, Routine w reflex microscopic, Vitamin D (25 hydroxy) See below   FH: osteopenia - Plan: DG Bone Density Estrogen deficiency - Plan: DG Bone Density    Flu shot utd 02/12/21 Tdap had 10/04/18  covid 19 vx declines  Hep B immune Consider MMR in future checked Rubella status ob/gyn in pregnancy Rubella abs 3.69 + Consider mumps and rubeola Pap per pt had 06/2018 need to copy copy requested today from ob/gyn  -pap  06/07/18 negative no HPV testing done scanned into chart  Dr. Gaetano Net ob/gyn f/u sch 07/2021 ? If will do pap   rec healthy diet and exercise  Sch fasting labs     Provider: Dr. Olivia Mackie McLean-[facility]

## 2021-06-17 ENCOUNTER — Other Ambulatory Visit: Payer: Self-pay

## 2021-06-17 ENCOUNTER — Other Ambulatory Visit (INDEPENDENT_AMBULATORY_CARE_PROVIDER_SITE_OTHER): Payer: No Typology Code available for payment source

## 2021-06-17 DIAGNOSIS — Z1329 Encounter for screening for other suspected endocrine disorder: Secondary | ICD-10-CM

## 2021-06-17 DIAGNOSIS — E559 Vitamin D deficiency, unspecified: Secondary | ICD-10-CM

## 2021-06-17 DIAGNOSIS — Z1389 Encounter for screening for other disorder: Secondary | ICD-10-CM

## 2021-06-17 DIAGNOSIS — Z13220 Encounter for screening for lipoid disorders: Secondary | ICD-10-CM

## 2021-06-17 DIAGNOSIS — Z0000 Encounter for general adult medical examination without abnormal findings: Secondary | ICD-10-CM

## 2021-06-17 LAB — LIPID PANEL
Cholesterol: 191 mg/dL (ref 0–200)
HDL: 54.7 mg/dL (ref >39.00)
LDL Cholesterol: 116 mg/dL — ABNORMAL HIGH (ref 0–99)
NonHDL: 135.88
Total CHOL/HDL Ratio: 3
Triglycerides: 101 mg/dL (ref 0.0–149.0)
VLDL: 20.2 mg/dL (ref 0.0–40.0)

## 2021-06-17 LAB — CBC WITH DIFFERENTIAL/PLATELET
Basophils Absolute: 0 10*3/uL (ref 0.0–0.1)
Basophils Relative: 0.8 % (ref 0.0–3.0)
Eosinophils Absolute: 0.1 10*3/uL (ref 0.0–0.7)
Eosinophils Relative: 3.6 % (ref 0.0–5.0)
HCT: 38.2 % (ref 36.0–46.0)
Hemoglobin: 13 g/dL (ref 12.0–15.0)
Lymphocytes Relative: 36.7 % (ref 12.0–46.0)
Lymphs Abs: 1.3 10*3/uL (ref 0.7–4.0)
MCHC: 34.1 g/dL (ref 30.0–36.0)
MCV: 92.5 fl (ref 78.0–100.0)
Monocytes Absolute: 0.3 10*3/uL (ref 0.1–1.0)
Monocytes Relative: 8.2 % (ref 3.0–12.0)
Neutro Abs: 1.9 10*3/uL (ref 1.4–7.7)
Neutrophils Relative %: 50.7 % (ref 43.0–77.0)
Platelets: 195 10*3/uL (ref 150.0–400.0)
RBC: 4.13 Mil/uL (ref 3.87–5.11)
RDW: 12.6 % (ref 11.5–15.5)
WBC: 3.7 10*3/uL — ABNORMAL LOW (ref 4.0–10.5)

## 2021-06-17 LAB — COMPREHENSIVE METABOLIC PANEL
ALT: 13 U/L (ref 0–35)
AST: 15 U/L (ref 0–37)
Albumin: 4.2 g/dL (ref 3.5–5.2)
Alkaline Phosphatase: 57 U/L (ref 39–117)
BUN: 15 mg/dL (ref 6–23)
CO2: 27 mEq/L (ref 19–32)
Calcium: 9.1 mg/dL (ref 8.4–10.5)
Chloride: 106 mEq/L (ref 96–112)
Creatinine, Ser: 0.82 mg/dL (ref 0.40–1.20)
GFR: 96.54 mL/min (ref >60.00)
Glucose, Bld: 90 mg/dL (ref 70–99)
Potassium: 4 mEq/L (ref 3.5–5.1)
Sodium: 140 mEq/L (ref 135–145)
Total Bilirubin: 1.3 mg/dL — ABNORMAL HIGH (ref 0.2–1.2)
Total Protein: 6.8 g/dL (ref 6.0–8.3)

## 2021-06-17 LAB — TSH: TSH: 1.67 u[IU]/mL (ref 0.35–5.50)

## 2021-06-17 LAB — VITAMIN D 25 HYDROXY (VIT D DEFICIENCY, FRACTURES): VITD: 44.27 ng/mL (ref 30.00–100.00)

## 2021-06-18 LAB — URINALYSIS, ROUTINE W REFLEX MICROSCOPIC
Bilirubin Urine: NEGATIVE
Glucose, UA: NEGATIVE
Hgb urine dipstick: NEGATIVE
Ketones, ur: NEGATIVE
Leukocytes,Ua: NEGATIVE
Nitrite: NEGATIVE
Protein, ur: NEGATIVE
Specific Gravity, Urine: 1.024 (ref 1.001–1.035)
pH: <=5 (ref 5.0–8.0)

## 2021-06-28 ENCOUNTER — Other Ambulatory Visit: Payer: Self-pay | Admitting: Internal Medicine

## 2021-06-28 DIAGNOSIS — J019 Acute sinusitis, unspecified: Secondary | ICD-10-CM

## 2021-06-28 DIAGNOSIS — J309 Allergic rhinitis, unspecified: Secondary | ICD-10-CM

## 2021-06-28 DIAGNOSIS — J329 Chronic sinusitis, unspecified: Secondary | ICD-10-CM

## 2021-06-28 DIAGNOSIS — H9203 Otalgia, bilateral: Secondary | ICD-10-CM

## 2021-06-29 ENCOUNTER — Other Ambulatory Visit: Payer: Self-pay

## 2021-06-29 MED ORDER — FLUTICASONE PROPIONATE 50 MCG/ACT NA SUSP
2.0000 | Freq: Every day | NASAL | 11 refills | Status: DC
  Filled 2021-06-29: qty 16, 30d supply, fill #0
  Filled 2021-07-31: qty 16, 30d supply, fill #1
  Filled 2021-09-19: qty 16, 30d supply, fill #2
  Filled 2021-10-26: qty 16, 30d supply, fill #3
  Filled 2022-01-18: qty 16, 30d supply, fill #4
  Filled 2022-03-15: qty 16, 30d supply, fill #5
  Filled 2022-04-28: qty 16, 30d supply, fill #6

## 2021-06-29 MED ORDER — LEVOCETIRIZINE DIHYDROCHLORIDE 5 MG PO TABS
ORAL_TABLET | ORAL | 3 refills | Status: DC
  Filled 2021-06-29: qty 90, 90d supply, fill #0
  Filled 2021-09-19: qty 90, 90d supply, fill #1
  Filled 2021-12-06: qty 90, 90d supply, fill #2
  Filled 2022-04-28: qty 90, 90d supply, fill #3

## 2021-07-19 ENCOUNTER — Telehealth: Payer: No Typology Code available for payment source | Admitting: Physician Assistant

## 2021-07-19 ENCOUNTER — Other Ambulatory Visit: Payer: Self-pay

## 2021-07-19 DIAGNOSIS — R3989 Other symptoms and signs involving the genitourinary system: Secondary | ICD-10-CM

## 2021-07-19 MED ORDER — SULFAMETHOXAZOLE-TRIMETHOPRIM 800-160 MG PO TABS
1.0000 | ORAL_TABLET | Freq: Two times a day (BID) | ORAL | 0 refills | Status: DC
  Filled 2021-07-19: qty 10, 5d supply, fill #0

## 2021-07-19 NOTE — Progress Notes (Signed)

## 2021-08-01 ENCOUNTER — Other Ambulatory Visit: Payer: Self-pay

## 2021-08-08 ENCOUNTER — Other Ambulatory Visit: Payer: Self-pay

## 2021-08-08 MED ORDER — ETONOGESTREL-ETHINYL ESTRADIOL 0.12-0.015 MG/24HR VA RING
VAGINAL_RING | VAGINAL | 0 refills | Status: DC
  Filled 2021-08-08: qty 1, 28d supply, fill #0

## 2021-08-12 ENCOUNTER — Other Ambulatory Visit: Payer: Self-pay

## 2021-08-12 ENCOUNTER — Encounter: Payer: Self-pay | Admitting: Internal Medicine

## 2021-08-12 MED ORDER — ETONOGESTREL-ETHINYL ESTRADIOL 0.12-0.015 MG/24HR VA RING
VAGINAL_RING | VAGINAL | 4 refills | Status: DC
  Filled 2021-08-12: qty 3, 90d supply, fill #0
  Filled 2021-09-21: qty 3, 84d supply, fill #0
  Filled 2022-01-18: qty 3, 84d supply, fill #1
  Filled 2022-04-28: qty 3, 84d supply, fill #2

## 2021-09-09 ENCOUNTER — Ambulatory Visit: Payer: No Typology Code available for payment source | Admitting: Internal Medicine

## 2021-09-20 ENCOUNTER — Other Ambulatory Visit: Payer: Self-pay

## 2021-09-21 ENCOUNTER — Other Ambulatory Visit: Payer: Self-pay

## 2021-10-26 ENCOUNTER — Other Ambulatory Visit: Payer: Self-pay

## 2021-12-06 ENCOUNTER — Other Ambulatory Visit: Payer: Self-pay

## 2021-12-11 ENCOUNTER — Telehealth: Payer: No Typology Code available for payment source | Admitting: Family

## 2021-12-11 DIAGNOSIS — J208 Acute bronchitis due to other specified organisms: Secondary | ICD-10-CM

## 2021-12-11 DIAGNOSIS — B9689 Other specified bacterial agents as the cause of diseases classified elsewhere: Secondary | ICD-10-CM | POA: Diagnosis not present

## 2021-12-11 MED ORDER — AZITHROMYCIN 250 MG PO TABS: ORAL_TABLET | ORAL | 0 refills | Status: DC

## 2021-12-11 MED ORDER — BENZONATATE 100 MG PO CAPS: 100.0000 mg | ORAL_CAPSULE | Freq: Three times a day (TID) | ORAL | 0 refills | Status: DC | PRN

## 2021-12-11 NOTE — Progress Notes (Signed)
We are sorry that you are not feeling well.  Here is how we plan to help!  Based on your presentation I believe you most likely have A cough due to bacteria.  When patients have a fever and a productive cough with a change in color or increased sputum production, we are concerned about bacterial bronchitis.  If left untreated it can progress to pneumonia.  If your symptoms do not improve with your treatment plan it is important that you contact your provider.   I have prescribed Azithromyin 250 mg: two tablets now and then one tablet daily for 4 additonal days    In addition you may use A non-prescription cough medication called Robitussin DAC. Take 2 teaspoons every 8 hours or Delsym: take 2 teaspoons every 12 hours., A non-prescription cough medication called Mucinex DM: take 2 tablets every 12 hours., and A prescription cough medication called Tessalon Perles 100mg. You may take 1-2 capsules every 8 hours as needed for your cough.    From your responses in the eVisit questionnaire you describe inflammation in the upper respiratory tract which is causing a significant cough.  This is commonly called Bronchitis and has four common causes:   Allergies Viral Infections Acid Reflux Bacterial Infection Allergies, viruses and acid reflux are treated by controlling symptoms or eliminating the cause. An example might be a cough caused by taking certain blood pressure medications. You stop the cough by changing the medication. Another example might be a cough caused by acid reflux. Controlling the reflux helps control the cough.  USE OF BRONCHODILATOR ("RESCUE") INHALERS: There is a risk from using your bronchodilator too frequently.  The risk is that over-reliance on a medication which only relaxes the muscles surrounding the breathing tubes can reduce the effectiveness of medications prescribed to reduce swelling and congestion of the tubes themselves.  Although you feel brief relief from the  bronchodilator inhaler, your asthma may actually be worsening with the tubes becoming more swollen and filled with mucus.  This can delay other crucial treatments, such as oral steroid medications. If you need to use a bronchodilator inhaler daily, several times per day, you should discuss this with your provider.  There are probably better treatments that could be used to keep your asthma under control.     HOME CARE Only take medications as instructed by your medical team. Complete the entire course of an antibiotic. Drink plenty of fluids and get plenty of rest. Avoid close contacts especially the very young and the elderly Cover your mouth if you cough or cough into your sleeve. Always remember to wash your hands A steam or ultrasonic humidifier can help congestion.   GET HELP RIGHT AWAY IF: You develop worsening fever. You become short of breath You cough up blood. Your symptoms persist after you have completed your treatment plan MAKE SURE YOU  Understand these instructions. Will watch your condition. Will get help right away if you are not doing well or get worse.    Thank you for choosing an e-visit.  Your e-visit answers were reviewed by a board certified advanced clinical practitioner to complete your personal care plan. Depending upon the condition, your plan could have included both over the counter or prescription medications.  Please review your pharmacy choice. Make sure the pharmacy is open so you can pick up prescription now. If there is a problem, you may contact your provider through MyChart messaging and have the prescription routed to another pharmacy.  Your safety is   important to us. If you have drug allergies check your prescription carefully.   For the next 24 hours you can use MyChart to ask questions about today's visit, request a non-urgent call back, or ask for a work or school excuse. You will get an email in the next two days asking about your experience. I  hope that your e-visit has been valuable and will speed your recovery.   Approximately 5 minutes was spent documenting and reviewing patient's chart.   

## 2021-12-12 ENCOUNTER — Other Ambulatory Visit: Payer: Self-pay

## 2022-01-18 ENCOUNTER — Other Ambulatory Visit: Payer: Self-pay

## 2022-03-16 ENCOUNTER — Other Ambulatory Visit: Payer: Self-pay

## 2022-04-28 ENCOUNTER — Telehealth: Payer: No Typology Code available for payment source | Admitting: Family Medicine

## 2022-04-28 DIAGNOSIS — J069 Acute upper respiratory infection, unspecified: Secondary | ICD-10-CM | POA: Diagnosis not present

## 2022-04-28 MED ORDER — BENZONATATE 200 MG PO CAPS: 200.0000 mg | ORAL_CAPSULE | Freq: Two times a day (BID) | ORAL | 0 refills | Status: DC | PRN

## 2022-04-28 MED ORDER — AZITHROMYCIN 250 MG PO TABS: ORAL_TABLET | ORAL | 0 refills | Status: AC

## 2022-04-28 NOTE — Progress Notes (Signed)

## 2022-05-01 ENCOUNTER — Other Ambulatory Visit: Payer: Self-pay

## 2022-05-16 ENCOUNTER — Encounter: Payer: Self-pay | Admitting: Nurse Practitioner

## 2022-05-16 ENCOUNTER — Ambulatory Visit (INDEPENDENT_AMBULATORY_CARE_PROVIDER_SITE_OTHER): Payer: 59 | Admitting: Nurse Practitioner

## 2022-05-16 VITALS — BP 118/60 | HR 83 | Temp 99.3°F | Ht 60.0 in | Wt 169.2 lb

## 2022-05-16 DIAGNOSIS — Z8262 Family history of osteoporosis: Secondary | ICD-10-CM | POA: Diagnosis not present

## 2022-05-16 DIAGNOSIS — E669 Obesity, unspecified: Secondary | ICD-10-CM | POA: Diagnosis not present

## 2022-05-16 DIAGNOSIS — J309 Allergic rhinitis, unspecified: Secondary | ICD-10-CM

## 2022-05-16 DIAGNOSIS — E785 Hyperlipidemia, unspecified: Secondary | ICD-10-CM

## 2022-05-16 DIAGNOSIS — Z0000 Encounter for general adult medical examination without abnormal findings: Secondary | ICD-10-CM

## 2022-05-16 DIAGNOSIS — E559 Vitamin D deficiency, unspecified: Secondary | ICD-10-CM | POA: Diagnosis not present

## 2022-05-16 NOTE — Assessment & Plan Note (Signed)
Stable. Continue OTC multivitamin with Vit D. Will recheck vitamin D level and order bone density scan.

## 2022-05-16 NOTE — Assessment & Plan Note (Signed)
Stable. Will recheck lipids. Encouraged to continue healthy diet and exercise.

## 2022-05-16 NOTE — Assessment & Plan Note (Signed)
Stable on OTC Flonase and Xyzal (antihistamine). Continue PRN.

## 2022-05-16 NOTE — Progress Notes (Signed)
Tomasita Morrow, NP-C Phone: [phone]  Kelly Crawford is a 32 y.o. female who presents today for transfer of care.She has no complaints or new concerns today.  HYPERLIPIDEMIA Symptoms Chest pain on exertion:  No   Leg claudication:   No Medications: Compliance- Not on medications. Currently diet controlled. Right upper quadrant pain- No  Muscle aches- No Lipid Panel     Component Value Date/Time   CHOL 191 06/17/2021 0747   TRIG 101.0 06/17/2021 0747   HDL 54.70 06/17/2021 0747   CHOLHDL 3 06/17/2021 0747   VLDL 20.2 06/17/2021 0747   LDLCALC 116 (H) 06/17/2021 0747   LDLDIRECT 169.0 11/22/2018 0802    Vit D Def- Taking OTC multivitamin. Family History of early osteoporosis/penia in mother. Did not complete bone density scan last year.   Social History   Tobacco Use  Smoking Status Never  Smokeless Tobacco Never    Current Outpatient Medications on File Prior to Visit  Medication Sig Dispense Refill   etonogestrel-ethinyl estradiol (NUVARING) 0.12-0.015 MG/24HR vaginal ring INSERT 1 RING VAGINALLY EVERY MONTH AS DIRECTED 3 each 4   fluticasone (FLONASE) 50 MCG/ACT nasal spray PLACE 2 SPRAYS INTO BOTH NOSTRILS DAILY. 16 g 11   levocetirizine (XYZAL) 5 MG tablet TAKE 1 TABLET BY MOUTH EVERY EVENING. STOP ALLEGRA 90 tablet 3   Vitamin D, Cholecalciferol, 50 MCG (2000 UT) CAPS Take 1 capsule by mouth daily.     No current facility-administered medications on file prior to visit.    ROS see history of present illness  Objective  Physical Exam Vitals:   05/16/22 1000  BP: 118/60  Pulse: 83  Temp: 99.3 F (37.4 C)  SpO2: 97%    BP Readings from Last 3 Encounters:  05/16/22 118/60  06/10/21 116/60  05/31/21 132/84   Wt Readings from Last 3 Encounters:  05/16/22 169 lb 3.2 oz (76.7 kg)  06/10/21 168 lb 3.2 oz (76.3 kg)  05/31/21 166 lb 12.8 oz (75.7 kg)    Physical Exam Constitutional:      General: She is not in acute distress.    Appearance: Normal  appearance.  HENT:     Head: Normocephalic.  Cardiovascular:     Rate and Rhythm: Normal rate and regular rhythm.     Heart sounds: Normal heart sounds.  Pulmonary:     Effort: Pulmonary effort is normal.     Breath sounds: Normal breath sounds.  Abdominal:     General: Abdomen is flat. Bowel sounds are normal.     Palpations: Abdomen is soft.     Tenderness: There is no abdominal tenderness.  Skin:    General: Skin is warm and dry.  Neurological:     General: No focal deficit present.     Mental Status: She is alert.  Psychiatric:        Mood and Affect: Mood normal.        Behavior: Behavior normal.     Assessment/Plan: Please see individual problem list.  Hyperlipidemia, unspecified hyperlipidemia type Assessment & Plan: Stable. Will recheck lipids. Encouraged to continue healthy diet and exercise.   Orders: -     Comprehensive metabolic panel; Future -     Lipid panel; Future  Allergic rhinitis, unspecified seasonality, unspecified trigger Assessment & Plan: Stable on OTC Flonase and Xyzal (antihistamine). Continue PRN.    Vitamin D deficiency Assessment & Plan: Stable. Continue OTC multivitamin with Vit D. Will recheck vitamin D level and order bone density scan.   Orders: -  VITAMIN D 25 Hydroxy (Vit-D Deficiency, Fractures); Future  Preventative health care -     CBC with Differential/Platelet; Future -     Comprehensive metabolic panel; Future -     TSH; Future  Obesity (BMI 30-39.9) -     Hemoglobin A1c; Future  Family history of osteoporosis -     DG Bone Density; Future    Return in about 6 weeks (around 06/27/2022) for Annual exam and lab review.   Tomasita Morrow, NP-C [facility]

## 2022-05-18 ENCOUNTER — Encounter: Payer: No Typology Code available for payment source | Admitting: Family Medicine

## 2022-05-31 ENCOUNTER — Encounter: Payer: Self-pay | Admitting: Nurse Practitioner

## 2022-06-16 DIAGNOSIS — N911 Secondary amenorrhea: Secondary | ICD-10-CM | POA: Diagnosis not present

## 2022-06-23 ENCOUNTER — Encounter: Payer: No Typology Code available for payment source | Admitting: Internal Medicine

## 2022-06-23 DIAGNOSIS — Z3481 Encounter for supervision of other normal pregnancy, first trimester: Secondary | ICD-10-CM | POA: Diagnosis not present

## 2022-06-23 DIAGNOSIS — Z3400 Encounter for supervision of normal first pregnancy, unspecified trimester: Secondary | ICD-10-CM | POA: Diagnosis not present

## 2022-06-23 DIAGNOSIS — Z3685 Encounter for antenatal screening for Streptococcus B: Secondary | ICD-10-CM | POA: Diagnosis not present

## 2022-06-23 DIAGNOSIS — Z3A08 8 weeks gestation of pregnancy: Secondary | ICD-10-CM | POA: Diagnosis not present

## 2022-06-23 LAB — HEPATITIS C ANTIBODY: HCV Ab: NEGATIVE

## 2022-06-23 LAB — OB RESULTS CONSOLE ABO/RH: RH Type: POSITIVE

## 2022-06-23 LAB — OB RESULTS CONSOLE HIV ANTIBODY (ROUTINE TESTING): HIV: NONREACTIVE

## 2022-06-23 LAB — OB RESULTS CONSOLE RUBELLA ANTIBODY, IGM: Rubella: IMMUNE

## 2022-06-23 LAB — OB RESULTS CONSOLE RPR: RPR: NONREACTIVE

## 2022-06-23 LAB — OB RESULTS CONSOLE ANTIBODY SCREEN: Antibody Screen: NEGATIVE

## 2022-06-23 LAB — OB RESULTS CONSOLE HEPATITIS B SURFACE ANTIGEN: Hepatitis B Surface Ag: NEGATIVE

## 2022-06-28 ENCOUNTER — Other Ambulatory Visit
Admission: RE | Admit: 2022-06-28 | Discharge: 2022-06-28 | Disposition: A | Discharge status: Home / Self Care | Payer: 59 | Attending: Nurse Practitioner | Admitting: Nurse Practitioner

## 2022-06-28 DIAGNOSIS — E669 Obesity, unspecified: Secondary | ICD-10-CM

## 2022-06-28 DIAGNOSIS — E785 Hyperlipidemia, unspecified: Secondary | ICD-10-CM | POA: Diagnosis not present

## 2022-06-28 DIAGNOSIS — Z0000 Encounter for general adult medical examination without abnormal findings: Secondary | ICD-10-CM

## 2022-06-28 DIAGNOSIS — E559 Vitamin D deficiency, unspecified: Secondary | ICD-10-CM | POA: Diagnosis not present

## 2022-06-28 LAB — CBC WITH DIFFERENTIAL/PLATELET
Abs Immature Granulocytes: 0.02 10*3/uL (ref 0.00–0.07)
Basophils Absolute: 0 10*3/uL (ref 0.0–0.1)
Basophils Relative: 0 %
Eosinophils Absolute: 0.2 10*3/uL (ref 0.0–0.5)
Eosinophils Relative: 3 %
HCT: 37.7 % (ref 36.0–46.0)
Hemoglobin: 12.9 g/dL (ref 12.0–15.0)
Immature Granulocytes: 0 %
Lymphocytes Relative: 26 %
Lymphs Abs: 1.5 10*3/uL (ref 0.7–4.0)
MCH: 32 pg (ref 26.0–34.0)
MCHC: 34.2 g/dL (ref 30.0–36.0)
MCV: 93.5 fL (ref 80.0–100.0)
Monocytes Absolute: 0.4 10*3/uL (ref 0.1–1.0)
Monocytes Relative: 6 %
Neutro Abs: 3.6 10*3/uL (ref 1.7–7.7)
Neutrophils Relative %: 65 %
Platelets: 207 10*3/uL (ref 150–400)
RBC: 4.03 MIL/uL (ref 3.87–5.11)
RDW: 12.6 % (ref 11.5–15.5)
WBC: 5.6 10*3/uL (ref 4.0–10.5)
nRBC: 0 % (ref 0.0–0.2)

## 2022-06-28 LAB — LIPID PANEL
Cholesterol: 201 mg/dL — ABNORMAL HIGH (ref 0–200)
HDL: 71 mg/dL (ref >40)
LDL Cholesterol: 114 mg/dL — ABNORMAL HIGH (ref 0–99)
Total CHOL/HDL Ratio: 2.8 RATIO
Triglycerides: 81 mg/dL (ref <150)
VLDL: 16 mg/dL (ref 0–40)

## 2022-06-28 LAB — COMPREHENSIVE METABOLIC PANEL
ALT: 17 U/L (ref 0–44)
AST: 18 U/L (ref 15–41)
Albumin: 3.6 g/dL (ref 3.5–5.0)
Alkaline Phosphatase: 51 U/L (ref 38–126)
Anion gap: 8 (ref 5–15)
BUN: 10 mg/dL (ref 6–20)
CO2: 23 mmol/L (ref 22–32)
Calcium: 8.7 mg/dL — ABNORMAL LOW (ref 8.9–10.3)
Chloride: 107 mmol/L (ref 98–111)
Creatinine, Ser: 0.56 mg/dL (ref 0.44–1.00)
GFR, Estimated: >60 mL/min (ref >60)
Glucose, Bld: 98 mg/dL (ref 70–99)
Potassium: 4.3 mmol/L (ref 3.5–5.1)
Sodium: 138 mmol/L (ref 135–145)
Total Bilirubin: 0.9 mg/dL (ref 0.3–1.2)
Total Protein: 7.1 g/dL (ref 6.5–8.1)

## 2022-06-28 LAB — HEMOGLOBIN A1C
Hgb A1c MFr Bld: 4.5 % — ABNORMAL LOW (ref 4.8–5.6)
Mean Plasma Glucose: 82.45 mg/dL

## 2022-06-28 LAB — TSH: TSH: 1.547 u[IU]/mL (ref 0.350–4.500)

## 2022-06-30 ENCOUNTER — Other Ambulatory Visit: Payer: Self-pay

## 2022-06-30 ENCOUNTER — Ambulatory Visit (INDEPENDENT_AMBULATORY_CARE_PROVIDER_SITE_OTHER): Payer: 59 | Admitting: Nurse Practitioner

## 2022-06-30 ENCOUNTER — Encounter: Payer: Self-pay | Admitting: Nurse Practitioner

## 2022-06-30 VITALS — BP 118/60 | HR 87 | Temp 98.7°F | Ht 60.0 in | Wt 170.6 lb

## 2022-06-30 DIAGNOSIS — E785 Hyperlipidemia, unspecified: Secondary | ICD-10-CM

## 2022-06-30 DIAGNOSIS — Z0000 Encounter for general adult medical examination without abnormal findings: Secondary | ICD-10-CM | POA: Diagnosis not present

## 2022-06-30 DIAGNOSIS — J309 Allergic rhinitis, unspecified: Secondary | ICD-10-CM | POA: Diagnosis not present

## 2022-06-30 MED ORDER — LEVOCETIRIZINE DIHYDROCHLORIDE 5 MG PO TABS
5.0000 mg | ORAL_TABLET | Freq: Every evening | ORAL | 3 refills | Status: DC
  Filled 2022-06-30 – 2022-08-02 (×4): qty 90, 90d supply, fill #0
  Filled 2022-11-01: qty 90, 90d supply, fill #1
  Filled 2023-01-21: qty 90, 90d supply, fill #2
  Filled 2023-01-25: qty 90, 90d supply, fill #0
  Filled 2023-05-01: qty 90, 90d supply, fill #1

## 2022-06-30 MED ORDER — FLUTICASONE PROPIONATE 50 MCG/ACT NA SUSP
2.0000 | Freq: Every day | NASAL | 5 refills | Status: DC
  Filled 2022-06-30: qty 16, 30d supply, fill #0
  Filled 2022-09-11: qty 16, 30d supply, fill #1
  Filled 2022-11-01: qty 16, 30d supply, fill #2
  Filled 2023-01-21: qty 16, 30d supply, fill #3
  Filled 2023-01-25: qty 16, 30d supply, fill #0
  Filled 2023-03-07: qty 16, 30d supply, fill #1
  Filled 2023-05-01: qty 16, 30d supply, fill #2

## 2022-06-30 NOTE — Assessment & Plan Note (Signed)
Stable. Diet controlled. LDL- 114. Statin therapy not indicated at this time. Encouraged to continue healthy diet and exercise. Will monitor.

## 2022-06-30 NOTE — Assessment & Plan Note (Signed)
Stable on Xyzal and Flonase. Continue. Safe for use during pregnancy. Refills sent.

## 2022-06-30 NOTE — Progress Notes (Signed)
Kelly Morrow, NP-C Phone: [phone]  Kelly Crawford is a 32 y.o. female who presents today for annual exam. She has no complaints or new concerns. She completed her lab work 2 days ago and would like to review her results. She is requesting refills on her allergy medications. She is currently [redacted] weeks pregnant. She is receiving prenatal care with her Ob-Gyn. She is taking a daily prenatal vitamin. Denies nausea or vomiting. Reports increased fatigue.   Diet: Well rounded, eating  frequent snack since becoming pregnant. Cut out soda Exercise: Tries to do 30 minutes to 1 hour of exercises/weight lifting 2-3 times weekly.  Pap smear: 07/26/2020 Family history-  Colon cancer: No  Breast cancer: Yes, mother  Ovarian cancer: No Menses: Pregnant. LMP- 04/23/22 Sexually active: Yes Vaccines-   Flu: UTD  Tetanus: 10/04/2018  COVID19: Never HIV screening: Negative Hep C Screening: Negative Tobacco use: No Alcohol use: No Illicit Drug use: No Dentist: Yes Ophthalmology: Yes  Social History   Tobacco Use  Smoking Status Never  Smokeless Tobacco Never    Current Outpatient Medications on File Prior to Visit  Medication Sig Dispense Refill   Vitamin D, Cholecalciferol, 50 MCG (2000 UT) CAPS Take 1 capsule by mouth daily.     [DISCONTINUED] etonogestrel-ethinyl estradiol (NUVARING) 0.12-0.015 MG/24HR vaginal ring INSERT 1 RING VAGINALLY EVERY MONTH AS DIRECTED 3 each 4   No current facility-administered medications on file prior to visit.    ROS see history of present illness  Objective  Physical Exam Vitals:   06/30/22 1054  BP: 118/60  Pulse: 87  Temp: 98.7 F (37.1 C)  SpO2: 99%    BP Readings from Last 3 Encounters:  06/30/22 118/60  05/16/22 118/60  06/10/21 116/60   Wt Readings from Last 3 Encounters:  06/30/22 170 lb 9.6 oz (77.4 kg)  05/16/22 169 lb 3.2 oz (76.7 kg)  06/10/21 168 lb 3.2 oz (76.3 kg)    Physical Exam Constitutional:      General: She  is not in acute distress.    Appearance: Normal appearance.  HENT:     Head: Normocephalic.     Right Ear: Tympanic membrane normal.     Left Ear: Tympanic membrane normal.     Nose: Nose normal.     Mouth/Throat:     Mouth: Mucous membranes are moist.     Pharynx: Oropharynx is clear.  Eyes:     Conjunctiva/sclera: Conjunctivae normal.     Pupils: Pupils are equal, round, and reactive to light.  Neck:     Thyroid: No thyromegaly.  Cardiovascular:     Rate and Rhythm: Normal rate and regular rhythm.     Heart sounds: Normal heart sounds.  Pulmonary:     Effort: Pulmonary effort is normal.     Breath sounds: Normal breath sounds.  Abdominal:     General: Abdomen is flat. Bowel sounds are normal.     Palpations: Abdomen is soft.     Tenderness: There is no abdominal tenderness.  Musculoskeletal:        General: Normal range of motion.  Lymphadenopathy:     Cervical: No cervical adenopathy.  Skin:    General: Skin is warm and dry.     Findings: No rash.  Neurological:     General: No focal deficit present.     Mental Status: She is alert.  Psychiatric:        Mood and Affect: Mood normal.  Behavior: Behavior normal.    Assessment/Plan: Please see individual problem list.  Preventative health care Assessment & Plan: Physical exam complete. Lab results reviewed with patient- stable, no changes needed at this time. Pap- UTD. Vaccines- UTD, declined all COVID vaccines. Advised annual follow up with Dentist and Ophthalmology. Encouraged to continue healthy diet, exercise and remain adequately hydrated. Continue daily prenatal vitamin. Follow up with Ob-Gyn.    Allergic rhinitis, unspecified seasonality, unspecified trigger Assessment & Plan: Stable on Xyzal and Flonase. Continue. Safe for use during pregnancy. Refills sent.   Orders: -     Fluticasone Propionate; Place 2 sprays into both nostrils daily.  Dispense: 16 g; Refill: 5 -     Levocetirizine  Dihydrochloride; Take 1 tablet (5 mg total) by mouth every evening.  Dispense: 90 tablet; Refill: 3  Hyperlipidemia, unspecified hyperlipidemia type Assessment & Plan: Stable. Diet controlled. LDL- 114. Statin therapy not indicated at this time. Encouraged to continue healthy diet and exercise. Will monitor.     Return in about 1 year (around 07/01/2023) for Annual Exam.   Kelly Morrow, NP-C [place]

## 2022-06-30 NOTE — Assessment & Plan Note (Addendum)
Physical exam complete. Lab results reviewed with patient- stable, no changes needed at this time. Pap- UTD. Vaccines- UTD, declined all COVID vaccines. Advised annual follow up with Dentist and Ophthalmology. Encouraged to continue healthy diet, exercise and remain adequately hydrated. Continue daily prenatal vitamin. Follow up with Ob-Gyn.

## 2022-07-04 DIAGNOSIS — Z3481 Encounter for supervision of other normal pregnancy, first trimester: Secondary | ICD-10-CM | POA: Diagnosis not present

## 2022-07-04 DIAGNOSIS — Z113 Encounter for screening for infections with a predominantly sexual mode of transmission: Secondary | ICD-10-CM | POA: Diagnosis not present

## 2022-07-04 DIAGNOSIS — Z3A10 10 weeks gestation of pregnancy: Secondary | ICD-10-CM | POA: Diagnosis not present

## 2022-07-04 LAB — OB RESULTS CONSOLE GC/CHLAMYDIA
Chlamydia: NEGATIVE
Neisseria Gonorrhea: NEGATIVE

## 2022-07-05 ENCOUNTER — Other Ambulatory Visit (HOSPITAL_BASED_OUTPATIENT_CLINIC_OR_DEPARTMENT_OTHER): Payer: Self-pay

## 2022-07-07 ENCOUNTER — Other Ambulatory Visit: Payer: Self-pay

## 2022-07-17 ENCOUNTER — Other Ambulatory Visit: Payer: Self-pay

## 2022-07-27 ENCOUNTER — Other Ambulatory Visit: Payer: Self-pay

## 2022-08-02 ENCOUNTER — Other Ambulatory Visit: Payer: Self-pay

## 2022-09-11 ENCOUNTER — Other Ambulatory Visit: Payer: Self-pay

## 2022-09-12 DIAGNOSIS — Z361 Encounter for antenatal screening for raised alphafetoprotein level: Secondary | ICD-10-CM | POA: Diagnosis not present

## 2022-09-12 DIAGNOSIS — Z3482 Encounter for supervision of other normal pregnancy, second trimester: Secondary | ICD-10-CM | POA: Diagnosis not present

## 2022-09-12 DIAGNOSIS — Z363 Encounter for antenatal screening for malformations: Secondary | ICD-10-CM | POA: Diagnosis not present

## 2022-09-12 DIAGNOSIS — Z3A20 20 weeks gestation of pregnancy: Secondary | ICD-10-CM | POA: Diagnosis not present

## 2022-11-01 ENCOUNTER — Telehealth: Payer: 59 | Admitting: Physician Assistant

## 2022-11-01 ENCOUNTER — Other Ambulatory Visit: Payer: Self-pay

## 2022-11-01 DIAGNOSIS — B9689 Other specified bacterial agents as the cause of diseases classified elsewhere: Secondary | ICD-10-CM

## 2022-11-01 DIAGNOSIS — J019 Acute sinusitis, unspecified: Secondary | ICD-10-CM | POA: Diagnosis not present

## 2022-11-01 MED ORDER — AZITHROMYCIN 250 MG PO TABS
ORAL_TABLET | ORAL | 0 refills | Status: AC
  Filled 2022-11-01: qty 6, 5d supply, fill #0

## 2022-11-01 NOTE — Progress Notes (Signed)
I have spent 5 minutes in review of e-visit questionnaire, review and updating patient chart, medical decision making and response to patient.   Zaylee Cornia Cody Randol Zumstein, PA-C    

## 2022-11-01 NOTE — Progress Notes (Signed)

## 2022-11-10 DIAGNOSIS — Z23 Encounter for immunization: Secondary | ICD-10-CM | POA: Diagnosis not present

## 2022-11-10 DIAGNOSIS — Z3A28 28 weeks gestation of pregnancy: Secondary | ICD-10-CM | POA: Diagnosis not present

## 2022-11-10 DIAGNOSIS — Z3480 Encounter for supervision of other normal pregnancy, unspecified trimester: Secondary | ICD-10-CM | POA: Diagnosis not present

## 2022-11-10 DIAGNOSIS — Z3483 Encounter for supervision of other normal pregnancy, third trimester: Secondary | ICD-10-CM | POA: Diagnosis not present

## 2022-11-23 DIAGNOSIS — O99810 Abnormal glucose complicating pregnancy: Secondary | ICD-10-CM | POA: Diagnosis not present

## 2023-01-05 DIAGNOSIS — Z3685 Encounter for antenatal screening for Streptococcus B: Secondary | ICD-10-CM | POA: Diagnosis not present

## 2023-01-05 LAB — OB RESULTS CONSOLE GBS: GBS: NEGATIVE

## 2023-01-10 ENCOUNTER — Encounter (HOSPITAL_COMMUNITY): Payer: Self-pay | Admitting: *Deleted

## 2023-01-10 ENCOUNTER — Encounter (HOSPITAL_COMMUNITY): Payer: Self-pay

## 2023-01-10 NOTE — Patient Instructions (Signed)
RIYANNA RADKA  01/10/2023   Your procedure is scheduled on:  01/23/2023  Arrive at 0530 at Entrance C on [address] at [facility]  and [employer]. You are invited to use the FREE valet parking or use the Visitor's parking deck.  Pick up the phone at the desk and dial [phone].  Call this number if you have problems the morning of surgery: [phone]  Remember:   Do not eat food:(After Midnight) Desps de medianoche.  Do not drink clear liquids: (After Midnight) Desps de medianoche.  Take these medicines the morning of surgery with A SIP OF WATER:  none   Do not wear jewelry, make-up or nail polish.  Do not wear lotions, powders, or perfumes. Do not wear deodorant.  Do not shave 48 hours prior to surgery.  Do not bring valuables to the hospital.  [facility] is not   responsible for any belongings or valuables brought to the hospital.  Contacts, dentures or bridgework may not be worn into surgery.  Leave suitcase in the car. After surgery it may be brought to your room.  For patients admitted to the hospital, checkout time is 11:00 AM the day of              discharge.      Please read over the following fact sheets that you were given:     Preparing for Surgery

## 2023-01-22 ENCOUNTER — Encounter (HOSPITAL_COMMUNITY)
Admission: RE | Admit: 2023-01-22 | Discharge: 2023-01-22 | Disposition: A | Discharge status: Home / Self Care | Payer: 59 | Source: Ambulatory Visit | Attending: Obstetrics and Gynecology | Admitting: Obstetrics and Gynecology

## 2023-01-22 DIAGNOSIS — Z01812 Encounter for preprocedural laboratory examination: Secondary | ICD-10-CM | POA: Insufficient documentation

## 2023-01-22 DIAGNOSIS — O34219 Maternal care for unspecified type scar from previous cesarean delivery: Secondary | ICD-10-CM | POA: Insufficient documentation

## 2023-01-22 DIAGNOSIS — Z3A39 39 weeks gestation of pregnancy: Secondary | ICD-10-CM | POA: Insufficient documentation

## 2023-01-22 HISTORY — DX: Other specified postprocedural states: Z98.890

## 2023-01-22 LAB — CBC
HCT: 40.5 % (ref 36.0–46.0)
Hemoglobin: 13.4 g/dL (ref 12.0–15.0)
MCH: 32 pg (ref 26.0–34.0)
MCHC: 33.1 g/dL (ref 30.0–36.0)
MCV: 96.7 fL (ref 80.0–100.0)
Platelets: 168 10*3/uL (ref 150–400)
RBC: 4.19 MIL/uL (ref 3.87–5.11)
RDW: 13.7 % (ref 11.5–15.5)
WBC: 7.6 10*3/uL (ref 4.0–10.5)
nRBC: 0 % (ref 0.0–0.2)

## 2023-01-22 LAB — TYPE AND SCREEN
ABO/RH(D): O POS
Antibody Screen: NEGATIVE

## 2023-01-22 LAB — RPR: RPR Ser Ql: NONREACTIVE

## 2023-01-23 ENCOUNTER — Inpatient Hospital Stay (HOSPITAL_COMMUNITY)
Admission: RE | Admit: 2023-01-23 | Discharge: 2023-01-25 | DRG: 788 | Disposition: A | Discharge status: Home / Self Care | Payer: 59 | Attending: Obstetrics and Gynecology | Admitting: Obstetrics and Gynecology

## 2023-01-23 ENCOUNTER — Encounter (HOSPITAL_COMMUNITY)
Admission: RE | Disposition: A | Discharge status: Home / Self Care | Payer: Self-pay | Source: Home / Self Care | Attending: Obstetrics and Gynecology

## 2023-01-23 ENCOUNTER — Inpatient Hospital Stay (HOSPITAL_COMMUNITY): Payer: 59 | Admitting: Anesthesiology

## 2023-01-23 ENCOUNTER — Encounter (HOSPITAL_COMMUNITY): Payer: Self-pay | Admitting: Obstetrics and Gynecology

## 2023-01-23 ENCOUNTER — Other Ambulatory Visit: Payer: Self-pay

## 2023-01-23 DIAGNOSIS — Z01812 Encounter for preprocedural laboratory examination: Secondary | ICD-10-CM | POA: Diagnosis not present

## 2023-01-23 DIAGNOSIS — O34211 Maternal care for low transverse scar from previous cesarean delivery: Secondary | ICD-10-CM

## 2023-01-23 DIAGNOSIS — Z3A39 39 weeks gestation of pregnancy: Secondary | ICD-10-CM | POA: Diagnosis not present

## 2023-01-23 DIAGNOSIS — O34219 Maternal care for unspecified type scar from previous cesarean delivery: Secondary | ICD-10-CM | POA: Diagnosis not present

## 2023-01-23 DIAGNOSIS — Z8249 Family history of ischemic heart disease and other diseases of the circulatory system: Secondary | ICD-10-CM

## 2023-01-23 DIAGNOSIS — Z3A00 Weeks of gestation of pregnancy not specified: Secondary | ICD-10-CM | POA: Diagnosis not present

## 2023-01-23 DIAGNOSIS — Z833 Family history of diabetes mellitus: Secondary | ICD-10-CM

## 2023-01-23 DIAGNOSIS — Z98891 History of uterine scar from previous surgery: Secondary | ICD-10-CM

## 2023-01-23 SURGERY — Surgical Case
Anesthesia: Spinal

## 2023-01-23 MED ORDER — PROPOFOL 10 MG/ML IV BOLUS
INTRAVENOUS | Status: DC | PRN
Start: 2023-01-23 — End: 2023-01-23
  Administered 2023-01-23: 20 mg via INTRAVENOUS

## 2023-01-23 MED ORDER — PROMETHAZINE HCL 25 MG/ML IJ SOLN: 6.2500 mg | INTRAMUSCULAR | Status: DC | PRN

## 2023-01-23 MED ORDER — DIPHENHYDRAMINE HCL 25 MG PO CAPS: 25.0000 mg | ORAL_CAPSULE | ORAL | Status: DC | PRN

## 2023-01-23 MED ORDER — SODIUM CHLORIDE 0.9 % IV SOLN
2.0000 g | INTRAVENOUS | Status: AC
  Administered 2023-01-23: 2 g via INTRAVENOUS

## 2023-01-23 MED ORDER — SCOPOLAMINE 1 MG/3DAYS TD PT72
MEDICATED_PATCH | TRANSDERMAL | Status: DC | PRN
  Administered 2023-01-23: 1 via TRANSDERMAL

## 2023-01-23 MED ORDER — WITCH HAZEL-GLYCERIN EX PADS: 1.0000 | MEDICATED_PAD | CUTANEOUS | Status: DC | PRN

## 2023-01-23 MED ORDER — CLINDAMYCIN PHOSPHATE 900 MG/50ML IV SOLN: 900.0000 mg | INTRAVENOUS | Status: DC

## 2023-01-23 MED ORDER — METOCLOPRAMIDE HCL 5 MG/ML IJ SOLN
INTRAMUSCULAR | Status: DC | PRN
  Administered 2023-01-23: 10 mg via INTRAVENOUS

## 2023-01-23 MED ORDER — SIMETHICONE 80 MG PO CHEW
80.0000 mg | CHEWABLE_TABLET | Freq: Three times a day (TID) | ORAL | Status: DC
  Administered 2023-01-23 – 2023-01-25 (×4): 80 mg via ORAL
  Filled 2023-01-23 (×4): qty 1

## 2023-01-23 MED ORDER — SENNOSIDES-DOCUSATE SODIUM 8.6-50 MG PO TABS
2.0000 | ORAL_TABLET | ORAL | Status: DC
  Administered 2023-01-24: 2 via ORAL
  Filled 2023-01-23 (×2): qty 2

## 2023-01-23 MED ORDER — TRANEXAMIC ACID-NACL 1000-0.7 MG/100ML-% IV SOLN
INTRAVENOUS | Status: AC
  Filled 2023-01-23: qty 100

## 2023-01-23 MED ORDER — SIMETHICONE 80 MG PO CHEW: 80.0000 mg | CHEWABLE_TABLET | ORAL | Status: DC | PRN

## 2023-01-23 MED ORDER — METOCLOPRAMIDE HCL 5 MG/ML IJ SOLN
INTRAMUSCULAR | Status: AC
  Filled 2023-01-23: qty 2

## 2023-01-23 MED ORDER — SODIUM CHLORIDE 0.9% FLUSH: 3.0000 mL | INTRAVENOUS | Status: DC | PRN

## 2023-01-23 MED ORDER — DEXAMETHASONE SODIUM PHOSPHATE 10 MG/ML IJ SOLN
INTRAMUSCULAR | Status: DC | PRN
  Administered 2023-01-23: 10 mg via INTRAVENOUS

## 2023-01-23 MED ORDER — OXYCODONE-ACETAMINOPHEN 5-325 MG PO TABS
1.0000 | ORAL_TABLET | ORAL | Status: DC | PRN
  Administered 2023-01-24 – 2023-01-25 (×2): 1 via ORAL
  Filled 2023-01-23 (×2): qty 1

## 2023-01-23 MED ORDER — OXYTOCIN-SODIUM CHLORIDE 30-0.9 UT/500ML-% IV SOLN
INTRAVENOUS | Status: DC | PRN
  Administered 2023-01-23: 300 mL via INTRAVENOUS

## 2023-01-23 MED ORDER — PHENYLEPHRINE 80 MCG/ML (10ML) SYRINGE FOR IV PUSH (FOR BLOOD PRESSURE SUPPORT)
PREFILLED_SYRINGE | INTRAVENOUS | Status: AC
  Filled 2023-01-23: qty 10

## 2023-01-23 MED ORDER — TETANUS-DIPHTH-ACELL PERTUSSIS 5-2.5-18.5 LF-MCG/0.5 IM SUSY: 0.5000 mL | PREFILLED_SYRINGE | Freq: Once | INTRAMUSCULAR | Status: DC

## 2023-01-23 MED ORDER — ACETAMINOPHEN 10 MG/ML IV SOLN
INTRAVENOUS | Status: AC
  Filled 2023-01-23: qty 100

## 2023-01-23 MED ORDER — PRENATAL MULTIVITAMIN CH
1.0000 | ORAL_TABLET | Freq: Every day | ORAL | Status: DC
  Administered 2023-01-24 – 2023-01-25 (×2): 1 via ORAL
  Filled 2023-01-23 (×2): qty 1

## 2023-01-23 MED ORDER — FENTANYL CITRATE (PF) 100 MCG/2ML IJ SOLN
INTRAMUSCULAR | Status: AC
  Filled 2023-01-23: qty 2

## 2023-01-23 MED ORDER — DIPHENHYDRAMINE HCL 25 MG PO CAPS: 25.0000 mg | ORAL_CAPSULE | Freq: Four times a day (QID) | ORAL | Status: DC | PRN

## 2023-01-23 MED ORDER — FENTANYL CITRATE (PF) 100 MCG/2ML IJ SOLN
INTRAMUSCULAR | Status: DC | PRN
  Administered 2023-01-23: 15 ug via INTRATHECAL

## 2023-01-23 MED ORDER — POVIDONE-IODINE 10 % EX SWAB
2.0000 | Freq: Once | CUTANEOUS | Status: AC
  Administered 2023-01-23: 2 via TOPICAL

## 2023-01-23 MED ORDER — DEXAMETHASONE SODIUM PHOSPHATE 4 MG/ML IJ SOLN
INTRAMUSCULAR | Status: AC
  Filled 2023-01-23: qty 2

## 2023-01-23 MED ORDER — BUPIVACAINE IN DEXTROSE 0.75-8.25 % IT SOLN
INTRATHECAL | Status: DC | PRN
  Administered 2023-01-23: 1.6 mL via INTRATHECAL

## 2023-01-23 MED ORDER — IBUPROFEN 600 MG PO TABS
600.0000 mg | ORAL_TABLET | Freq: Four times a day (QID) | ORAL | Status: DC
  Administered 2023-01-24 – 2023-01-25 (×5): 600 mg via ORAL
  Filled 2023-01-23 (×5): qty 1

## 2023-01-23 MED ORDER — MORPHINE SULFATE (PF) 0.5 MG/ML IJ SOLN
INTRAMUSCULAR | Status: AC
  Filled 2023-01-23: qty 10

## 2023-01-23 MED ORDER — NALOXONE HCL 0.4 MG/ML IJ SOLN: 0.4000 mg | INTRAMUSCULAR | Status: DC | PRN

## 2023-01-23 MED ORDER — NALOXONE HCL 4 MG/10ML IJ SOLN: 1.0000 ug/kg/h | INTRAVENOUS | Status: DC | PRN

## 2023-01-23 MED ORDER — SODIUM CHLORIDE 0.9 % IV SOLN
INTRAVENOUS | Status: AC
  Filled 2023-01-23: qty 2

## 2023-01-23 MED ORDER — LACTATED RINGERS IV SOLN: INTRAVENOUS | Status: DC

## 2023-01-23 MED ORDER — OXYCODONE HCL 5 MG/5ML PO SOLN: 5.0000 mg | Freq: Once | ORAL | Status: DC | PRN

## 2023-01-23 MED ORDER — MORPHINE SULFATE (PF) 0.5 MG/ML IJ SOLN
INTRAMUSCULAR | Status: DC | PRN
  Administered 2023-01-23: 150 ug via INTRATHECAL

## 2023-01-23 MED ORDER — GENTAMICIN SULFATE 40 MG/ML IJ SOLN
5.0000 mg/kg | INTRAVENOUS | Status: DC
  Filled 2023-01-23: qty 8.25

## 2023-01-23 MED ORDER — KETOROLAC TROMETHAMINE 30 MG/ML IJ SOLN: 30.0000 mg | Freq: Once | INTRAMUSCULAR | Status: DC | PRN

## 2023-01-23 MED ORDER — PHENYLEPHRINE HCL-NACL 20-0.9 MG/250ML-% IV SOLN
INTRAVENOUS | Status: DC | PRN
  Administered 2023-01-23: 60 ug/min via INTRAVENOUS

## 2023-01-23 MED ORDER — OXYTOCIN-SODIUM CHLORIDE 30-0.9 UT/500ML-% IV SOLN
INTRAVENOUS | Status: AC
  Filled 2023-01-23: qty 500

## 2023-01-23 MED ORDER — MENTHOL 3 MG MT LOZG: 1.0000 | LOZENGE | OROMUCOSAL | Status: DC | PRN

## 2023-01-23 MED ORDER — OXYCODONE HCL 5 MG PO TABS: 5.0000 mg | ORAL_TABLET | Freq: Once | ORAL | Status: DC | PRN

## 2023-01-23 MED ORDER — PHENYLEPHRINE HCL-NACL 20-0.9 MG/250ML-% IV SOLN
INTRAVENOUS | Status: AC
  Filled 2023-01-23: qty 250

## 2023-01-23 MED ORDER — ONDANSETRON HCL 4 MG/2ML IJ SOLN
INTRAMUSCULAR | Status: DC | PRN
  Administered 2023-01-23: 4 mg via INTRAVENOUS

## 2023-01-23 MED ORDER — ZOLPIDEM TARTRATE 5 MG PO TABS: 5.0000 mg | ORAL_TABLET | Freq: Every evening | ORAL | Status: DC | PRN

## 2023-01-23 MED ORDER — PROPOFOL 10 MG/ML IV BOLUS
INTRAVENOUS | Status: AC
  Filled 2023-01-23: qty 20

## 2023-01-23 MED ORDER — MEPERIDINE HCL 25 MG/ML IJ SOLN: 6.2500 mg | INTRAMUSCULAR | Status: DC | PRN

## 2023-01-23 MED ORDER — DIPHENHYDRAMINE HCL 50 MG/ML IJ SOLN: 12.5000 mg | INTRAMUSCULAR | Status: DC | PRN

## 2023-01-23 MED ORDER — COCONUT OIL OIL: 1.0000 | TOPICAL_OIL | Status: DC | PRN

## 2023-01-23 MED ORDER — ONDANSETRON HCL 4 MG/2ML IJ SOLN
INTRAMUSCULAR | Status: AC
  Filled 2023-01-23: qty 2

## 2023-01-23 MED ORDER — CLINDAMYCIN PHOSPHATE 900 MG/50ML IV SOLN
INTRAVENOUS | Status: AC
  Filled 2023-01-23: qty 50

## 2023-01-23 MED ORDER — ACETAMINOPHEN 325 MG PO TABS
650.0000 mg | ORAL_TABLET | ORAL | Status: DC | PRN
  Administered 2023-01-23: 650 mg via ORAL
  Filled 2023-01-23: qty 2

## 2023-01-23 MED ORDER — OXYTOCIN-SODIUM CHLORIDE 30-0.9 UT/500ML-% IV SOLN
2.5000 [IU]/h | INTRAVENOUS | Status: AC
  Administered 2023-01-23 (×2): 2.5 [IU]/h via INTRAVENOUS
  Filled 2023-01-23: qty 500

## 2023-01-23 MED ORDER — TRANEXAMIC ACID-NACL 1000-0.7 MG/100ML-% IV SOLN
1000.0000 mg | Freq: Once | INTRAVENOUS | Status: AC
  Administered 2023-01-23: 1000 mg via INTRAVENOUS

## 2023-01-23 MED ORDER — DIBUCAINE (PERIANAL) 1 % EX OINT: 1.0000 | TOPICAL_OINTMENT | CUTANEOUS | Status: DC | PRN

## 2023-01-23 MED ORDER — HYDROMORPHONE HCL 1 MG/ML IJ SOLN: 0.2500 mg | INTRAMUSCULAR | Status: DC | PRN

## 2023-01-23 MED ORDER — KETOROLAC TROMETHAMINE 30 MG/ML IJ SOLN
30.0000 mg | Freq: Four times a day (QID) | INTRAMUSCULAR | Status: AC
  Administered 2023-01-23 – 2023-01-24 (×4): 30 mg via INTRAVENOUS
  Filled 2023-01-23 (×4): qty 1

## 2023-01-23 MED ORDER — MEASLES, MUMPS & RUBELLA VAC IJ SOLR: 0.5000 mL | Freq: Once | INTRAMUSCULAR | Status: DC

## 2023-01-23 SURGICAL SUPPLY — 30 items
APL PRP STRL LF DISP 70% ISPRP (MISCELLANEOUS) ×2
APL SKNCLS STERI-STRIP NONHPOA (GAUZE/BANDAGES/DRESSINGS) ×1
BENZOIN TINCTURE PRP APPL 2/3 (GAUZE/BANDAGES/DRESSINGS) IMPLANT
CHLORAPREP W/TINT 26 (MISCELLANEOUS) ×2 IMPLANT
CLAMP UMBILICAL CORD (MISCELLANEOUS) ×1 IMPLANT
CLIP FILSHIE TUBAL LIGA STRL (Clip) IMPLANT
CLOTH BEACON ORANGE TIMEOUT ST (SAFETY) ×1 IMPLANT
DRSG OPSITE POSTOP 4X10 (GAUZE/BANDAGES/DRESSINGS) ×1 IMPLANT
DRSG OPSITE POSTOP 4X8 (GAUZE/BANDAGES/DRESSINGS) IMPLANT
ELECT REM PT RETURN 9FT ADLT (ELECTROSURGICAL) ×1
ELECTRODE REM PT RTRN 9FT ADLT (ELECTROSURGICAL) ×1 IMPLANT
EXTRACTOR VACUUM M CUP 4 TUBE (SUCTIONS) IMPLANT
GLOVE BIOGEL PI IND STRL 7.0 (GLOVE) ×1 IMPLANT
GLOVE SURG ORTHO 8.0 STRL STRW (GLOVE) ×1 IMPLANT
GOWN STRL REUS W/TWL LRG LVL3 (GOWN DISPOSABLE) ×2 IMPLANT
KIT ABG SYR 3ML LUER SLIP (SYRINGE) ×1 IMPLANT
NDL HYPO 25X5/8 SAFETYGLIDE (NEEDLE) ×1 IMPLANT
NEEDLE HYPO 25X5/8 SAFETYGLIDE (NEEDLE) ×1 IMPLANT
NS IRRIG 1000ML POUR BTL (IV SOLUTION) ×1 IMPLANT
PACK C SECTION WH (CUSTOM PROCEDURE TRAY) ×1 IMPLANT
PAD OB MATERNITY 4.3X12.25 (PERSONAL CARE ITEMS) ×1 IMPLANT
STRIP CLOSURE SKIN 1/4X4 (GAUZE/BANDAGES/DRESSINGS) IMPLANT
SUT MNCRL 0 VIOLET CTX 36 (SUTURE) ×3 IMPLANT
SUT MON AB 4-0 PS1 27 (SUTURE) ×1 IMPLANT
SUT PDS AB 1 CT 36 (SUTURE) IMPLANT
SUT VIC AB 1 CTX 36 (SUTURE)
SUT VIC AB 1 CTX36XBRD ANBCTRL (SUTURE) IMPLANT
TOWEL OR 17X24 6PK STRL BLUE (TOWEL DISPOSABLE) ×1 IMPLANT
TRAY FOLEY W/BAG SLVR 14FR LF (SET/KITS/TRAYS/PACK) ×1 IMPLANT
WATER STERILE IRR 1000ML POUR (IV SOLUTION) ×1 IMPLANT

## 2023-01-23 NOTE — Anesthesia Postprocedure Evaluation (Signed)
Anesthesia Post Note  Patient: Kelly Crawford  Procedure(s) Performed: REPEAT CESAREAN SECTION EDC: 01-28-23 ALLERG: AUGMENTIN  PREIVOUS X 1     Patient location during evaluation: PACU Anesthesia Type: Spinal Level of consciousness: awake and alert Pain management: pain level controlled Vital Signs Assessment: post-procedure vital signs reviewed and stable Respiratory status: spontaneous breathing, nonlabored ventilation and respiratory function stable Cardiovascular status: blood pressure returned to baseline and stable Postop Assessment: no apparent nausea or vomiting Anesthetic complications: no   No notable events documented.  Last Vitals:  Vitals:   01/23/23 1000 01/23/23 1023  BP: (!) 117/58 (!) 105/54  Pulse: (!) 54 (!) 51  Resp: 15 16  Temp:  36.9 C  SpO2: 94% 97%    Last Pain:  Vitals:   01/23/23 1023  TempSrc: Axillary  PainSc:                  Lowella Curb

## 2023-01-23 NOTE — Transfer of Care (Signed)
Immediate Anesthesia Transfer of Care Note  Patient: Kelly Crawford  Procedure(s) Performed: REPEAT CESAREAN SECTION EDC: 01-28-23 ALLERG: AUGMENTIN  PREIVOUS X 1  Patient Location: PACU  Anesthesia Type:Spinal  Level of Consciousness: awake, alert , and oriented  Airway & Oxygen Therapy: Patient Spontanous Breathing  Post-op Assessment: Report given to RN and Post -op Vital signs reviewed and stable  Post vital signs: stable  Last Vitals:  Vitals Value Taken Time  BP 114/53 01/23/23 0908  Temp    Pulse 70 01/23/23 0913  Resp 17 01/23/23 0913  SpO2 94 % 01/23/23 0913  Vitals shown include unfiled device data.  Last Pain:  Vitals:   01/23/23 0546  TempSrc: Oral  PainSc: 0-No pain         Complications: No notable events documented.

## 2023-01-23 NOTE — Anesthesia Procedure Notes (Signed)
Spinal  Patient location during procedure: OB Start time: 01/23/2023 7:41 AM End time: 01/23/2023 7:46 AM Reason for block: surgical anesthesia Staffing Performed: anesthesiologist  Anesthesiologist: Lowella Curb, MD Performed by: Lowella Curb, MD Authorized by: Lowella Curb, MD   Preanesthetic Checklist Completed: patient identified, IV checked, risks and benefits discussed, surgical consent, monitors and equipment checked, pre-op evaluation and timeout performed Spinal Block Patient position: sitting Prep: DuraPrep and site prepped and draped Patient monitoring: heart rate, cardiac monitor, continuous pulse ox and blood pressure Approach: midline Location: L3-4 Injection technique: single-shot Needle Needle type: Pencan  Needle gauge: 24 G Needle length: 10 cm Assessment Sensory level: T4 Events: CSF return

## 2023-01-23 NOTE — Op Note (Signed)
Cesarean Section Procedure Note  Pre-operative Diagnosis: IUP at 39 weeks, previous c/s for repeat  Post-operative Diagnosis: same  Surgeon: Turner Daniels   Assistants: Herbert Seta, Surg tech  Anesthesia: spinal  Procedure:  Low Segment Transverse cesarean section  Procedure Details  The patient was seen in the Holding Room. The risks, benefits, complications, treatment options, and expected outcomes were discussed with the patient.  The patient concurred with the proposed plan, giving informed consent.  The site of surgery properly noted/marked.. A Time Out was held and the above information confirmed.  After induction of anesthesia, the patient was draped and prepped in the usual sterile manner. A Pfannenstiel incision was made and carried down through the subcutaneous tissue to the fascia. Fascial incision was made and extended transversely. The fascia was separated from the underlying rectus tissue superiorly and inferiorly. The peritoneum was identified and entered. Peritoneal incision was extended longitudinally. The utero-vesical peritoneal reflection was incised transversely and the bladder flap was bluntly freed from the lower uterine segment. A low transverse uterine incision was made. Delivered from Vertex presentation was a baby with Apgar scores of 9 at one minute and 9 at five minutes. After the umbilical cord was clamped and cut cord blood was obtained for evaluation. The placenta was removed intact and appeared normal. The uterine outline, tubes and ovaries appeared normal. The uterine incision was closed with running locked sutures of 0 monocryl and imbricated with 0 monocryl. Hemostasis was observed. Lavage was carried out until clear. The peritoneum was then closed with 0 monocryl and rectus muscles plicated in the midline.  After hemostasis was assured, the fascia was then reapproximated with running sutures of 0 PDS. Irrigation was applied and after adequate hemostasis was assured,  the skin was reapproximated with subcutaneous sutures using 4-0 monocryl.  Instrument, sponge, and needle counts were correct prior the abdominal closure and at the conclusion of the case. The patient received 2 grams cefotetan preoperatively.  Findings: Viable female  Estimated Blood Loss:  242cc         Specimens: Placenta was sent to labor and delivery         Complications:  None

## 2023-01-23 NOTE — Lactation Note (Signed)
This note was copied from a baby's chart. Lactation Consultation Note  Patient Name: Kelly Crawford [id number] Date: 01/23/2023 Age:31 hours Reason for consult: Initial assessment: See Birth Parent's MR: C/S delivery.  Per Birth Parent, she really wants to BF with her daughter, 1st child ( son) was in NICU and did not latch, she mostly pumped see maternal data below. Birth Parent attempted latch infant on her right breast and cradle hold positions, infant was on and off the breast for 3 minutes, did not sustain latch. LC reviewed hand expression using breast model and Birth Parent self expressed few drops of colostrum that was spoon fed to infant. Birth Parent will continue to work on latching infant at the breast, by cues, every 2-3 hours, skin to skin. Birth Parent will ask RN/LC for further latch assistance if needed. Birth Parent is [name] and will review handout on DEBP choices and call LC services when she has decided on which DEBP she would like. While LC was in the room, infant had two voids, infant had 2 voids and 2 stools since birth. Birth Parent was  made aware of O/P services, breastfeeding support groups, community resources, and our phone # for post-discharge questions.    Today's feeding plan: 1- Continue to BF infant by cues, every 2-3 hours. Continue to ask RN/LC for latch assistance. 2- If infant doesn't latch continue to do lots of skin to skin and offer hand expressed colostrum by spoon. 3- Call [facility] services when Birth Parent has decided which Medela DEBP she  would like.  Maternal Data Has patient been taught Hand Expression?: Yes Does the patient have breastfeeding experience prior to this delivery?: Yes How long did the patient breastfeed?: Per Birth Parent, She had latch difficulties with her 1st child so she exclusively pumped for 9 months with her 30 year old son.  Feeding Mother's Current Feeding Choice: Breast Milk  LATCH Score Latch: Repeated  attempts needed to sustain latch, nipple held in mouth throughout feeding, stimulation needed to elicit sucking reflex.  Audible Swallowing: A few with stimulation  Type of Nipple: Everted at rest and after stimulation (( short shafted))  Comfort (Breast/Nipple): Soft / non-tender  Hold (Positioning): Assistance needed to correctly position infant at breast and maintain latch.  LATCH Score: 7   Lactation Tools Discussed/Used Tools: Pump Breast pump type: Manual Pump Education: Setup, frequency, and cleaning Reason for Pumping: mother's request  Interventions Interventions: Breast feeding basics reviewed;Assisted with latch;Skin to skin;Reverse pressure;Breast compression;Adjust position;Support pillows;Position options;Expressed milk;Education;LC Services brochure;Guidelines for Milk Supply and Pumping Schedule Handout;CDC Guidelines for Breast Pump Cleaning  Discharge Pump: Employee Pump (Birth Parent will decided which Medela DEBP she will like and  call LC services when ready handout given)  Consult Status Consult Status: Follow-up Date: 01/24/23 Follow-up type: In-patient    Kelly Crawford 01/23/2023, 6:12 PM

## 2023-01-23 NOTE — H&P (Signed)
Kelly Crawford is a 32 y.o. female presenting for repeat c/s.  Pregnancy uncomplicated.  GBS neg. OB History     Gravida  2   Para  1   Term  1   Preterm      AB      Living  1      SAB      IAB      Ectopic      Multiple  0   Live Births  1          Past Medical History:  Diagnosis Date   Allergy    bee venom, pollen, dust, mold and mildew h/o allergy shots at child   Blood type O+    PONV (postoperative nausea and vomiting)    Pregnancy    due 12/29/2018 1st child   Past Surgical History:  Procedure Laterality Date   ADENOIDECTOMY     CESAREAN SECTION N/A 12/08/2018   Procedure: CESAREAN SECTION;  Surgeon: Candice Camp, MD;  Location: [facility] LD ORS;  Service: Obstetrics;  Laterality: N/A;   EYE SURGERY     tubes in ears     WISDOM TOOTH EXTRACTION     Family History: family history includes Cancer in her maternal grandfather and mother; Diabetes in her maternal grandfather and maternal grandmother; Hyperlipidemia in her maternal grandfather, maternal grandmother, and another family member; Hypertension in her maternal grandfather and maternal grandmother; Osteopenia in her mother. Social History:  reports that she has never smoked. She has never used smokeless tobacco. She reports that she does not currently use alcohol. She reports that she does not currently use drugs.     Maternal Diabetes: No Genetic Screening: Normal Maternal Ultrasounds/Referrals: Normal Fetal Ultrasounds or other Referrals:  None Maternal Substance Abuse:  No Significant Maternal Medications:  None Significant Maternal Lab Results:  Group B Strep negative Number of Prenatal Visits:greater than 3 verified prenatal visits Other Comments:  None  Review of Systems History   Blood pressure 138/84, pulse 85, temperature (!) 97.5 F (36.4 C), temperature source Oral, resp. rate 16, height 5' (1.524 m), weight 98 kg, SpO2 97%. Exam Physical Exam  Vitals and nursing note reviewed.  Exam conducted with a chaperone present.  Constitutional:      Appearance: Normal appearance.  HENT:     Head: Normocephalic.  Eyes:     Pupils: Pupils are equal, round, and reactive to light.  Cardiovascular:     Rate and Rhythm: Normal rate and regular rhythm.     Pulses: Normal pulses.  Abdominal:     General: Abdomen is Gravid, nontender Neurological:     Mental Status: She is alert. Prenatal labs: ABO, Rh: --/--/O POS (09/09 1478) Antibody: NEG (09/09 0909) Rubella: Immune (02/09 0000) RPR: NON REACTIVE (09/09 0912)  HBsAg: Negative (02/09 0000)  HIV: Non-reactive (02/09 0000)  GBS: Negative/-- (08/23 0000)   Assessment/Plan: IUP at 39 weeks.  Previous c/s desires repeat. Risks and benefits of C/S were discussed.  All questions were answered and informed consent was obtained.  Plan to proceed with low segment transverse Cesarean Section.This patient has been seen and examined.   All of her questions were answered.  Labs and vital signs reviewed.  Informed consent has been obtained.  The History and Physical is current.   Turner Daniels 01/23/2023, 7:30 AM

## 2023-01-23 NOTE — Anesthesia Preprocedure Evaluation (Signed)
Anesthesia Evaluation  Patient identified by MRN, date of birth, ID band Patient awake    Reviewed: Allergy & Precautions, NPO status , Patient's Chart, lab work & pertinent test results  History of Anesthesia Complications (+) PONV and history of anesthetic complications  Airway Mallampati: II  TM Distance: >3 FB Neck ROM: Full    Dental no notable dental hx.    Pulmonary neg pulmonary ROS   Pulmonary exam normal breath sounds clear to auscultation       Cardiovascular negative cardio ROS Normal cardiovascular exam Rhythm:Regular Rate:Normal     Neuro/Psych negative neurological ROS  negative psych ROS   GI/Hepatic negative GI ROS, Neg liver ROS,,,  Endo/Other  negative endocrine ROS    Renal/GU negative Renal ROS  negative genitourinary   Musculoskeletal negative musculoskeletal ROS (+)    Abdominal  (+) + obese  Peds  Hematology negative hematology ROS (+)   Anesthesia Other Findings   Reproductive/Obstetrics (+) Pregnancy                             Anesthesia Physical Anesthesia Plan  ASA: II  Anesthesia Plan: Spinal   Post-op Pain Management:    Induction:   PONV Risk Score and Plan: 3 and Treatment may vary due to age or medical condition  Airway Management Planned: Natural Airway  Additional Equipment:   Intra-op Plan:   Post-operative Plan:   Informed Consent: I have reviewed the patients History and Physical, chart, labs and discussed the procedure including the risks, benefits and alternatives for the proposed anesthesia with the patient or authorized representative who has indicated his/her understanding and acceptance.     Dental advisory given  Plan Discussed with: CRNA  Anesthesia Plan Comments:         Anesthesia Quick Evaluation

## 2023-01-24 LAB — CBC
HCT: 31.2 % — ABNORMAL LOW (ref 36.0–46.0)
Hemoglobin: 10.5 g/dL — ABNORMAL LOW (ref 12.0–15.0)
MCH: 33.7 pg (ref 26.0–34.0)
MCHC: 33.7 g/dL (ref 30.0–36.0)
MCV: 100 fL (ref 80.0–100.0)
Platelets: 121 10*3/uL — ABNORMAL LOW (ref 150–400)
RBC: 3.12 MIL/uL — ABNORMAL LOW (ref 3.87–5.11)
RDW: 13.9 % (ref 11.5–15.5)
WBC: 10 10*3/uL (ref 4.0–10.5)
nRBC: 0 % (ref 0.0–0.2)

## 2023-01-24 LAB — BIRTH TISSUE RECOVERY COLLECTION (PLACENTA DONATION)

## 2023-01-24 NOTE — Progress Notes (Signed)
POD # 1  Doing well Pain under good control.   BP 110/68 (BP Location: Left Arm)   Pulse 89   Temp 98.2 F (36.8 C) (Oral)   Resp 18   Ht 5' (1.524 m)   Wt 98 kg   SpO2 100%   Breastfeeding Unknown   BMI 42.20 kg/m  Results for orders placed or performed during the hospital encounter of 01/23/23 (from the past 24 hour(s))  CBC     Status: Abnormal   Collection Time: 01/24/23  5:41 AM  Result Value Ref Range   WBC 10.0 4.0 - 10.5 K/uL   RBC 3.12 (L) 3.87 - 5.11 MIL/uL   Hemoglobin 10.5 (L) 12.0 - 15.0 g/dL   HCT 16.1 (L) 09.6 - 04.5 %   MCV 100.0 80.0 - 100.0 fL   MCH 33.7 26.0 - 34.0 pg   MCHC 33.7 30.0 - 36.0 g/dL   RDW 40.9 81.1 - 91.4 %   Platelets 121 (L) 150 - 400 K/uL   nRBC 0.0 0.0 - 0.2 %  Collect bld for placenta donatation     Status: None   Collection Time: 01/24/23  5:48 AM  Result Value Ref Range   Placenta donation bld collect Collected by Laboratory    Bandage is clean and dry and intact   POD # 1  Doing well Routine care

## 2023-01-25 ENCOUNTER — Other Ambulatory Visit (HOSPITAL_COMMUNITY): Payer: Self-pay

## 2023-01-25 ENCOUNTER — Other Ambulatory Visit: Payer: Self-pay

## 2023-01-25 MED ORDER — SENNOSIDES-DOCUSATE SODIUM 8.6-50 MG PO TABS
2.0000 | ORAL_TABLET | ORAL | 1 refills | Status: DC
Start: 2023-01-25 — End: 2023-07-06
  Filled 2023-01-25 (×2): qty 30, 15d supply, fill #0

## 2023-01-25 MED ORDER — OXYCODONE HCL 5 MG PO TABS: 5.0000 mg | ORAL_TABLET | ORAL | 0 refills | Status: DC | PRN

## 2023-01-25 MED ORDER — ACETAMINOPHEN 325 MG PO TABS
650.0000 mg | ORAL_TABLET | ORAL | 1 refills | Status: DC | PRN
Start: 2023-01-25 — End: 2023-07-06
  Filled 2023-01-25 (×2): qty 30, 3d supply, fill #0

## 2023-01-25 MED ORDER — IBUPROFEN 600 MG PO TABS
600.0000 mg | ORAL_TABLET | Freq: Four times a day (QID) | ORAL | 0 refills | Status: DC
  Filled 2023-01-25 (×2): qty 30, 8d supply, fill #0

## 2023-01-25 MED ORDER — OXYCODONE HCL 5 MG PO TABS
5.0000 mg | ORAL_TABLET | ORAL | 0 refills | Status: DC | PRN
Start: 2023-01-25 — End: 2023-01-25
  Filled 2023-01-25 (×2): qty 15, 3d supply, fill #0

## 2023-01-25 NOTE — Discharge Summary (Signed)
Postpartum Discharge Summary  Date of Service updated 01/25/2023     Patient Name: Kelly Crawford DOB: [birth year] MRN: [id number]  Date of admission: 01/23/2023 Delivery date:01/23/2023 Delivering provider: Candice Camp Date of discharge: 01/25/2023  Admitting diagnosis: Previous cesarean section [Z98.891] Intrauterine pregnancy: [redacted]w[redacted]d     Secondary diagnosis:  Principal Problem:   Previous cesarean section  Additional problems: none    Discharge diagnosis: Term Pregnancy Delivered                                              Post partum procedures: none Augmentation: N/A Complications: None  Hospital course: Scheduled C/S   32 y.o. yo G2P2002 at [redacted]w[redacted]d was admitted to the hospital 01/23/2023 for scheduled cesarean section with the following indication:Elective Repeat.Delivery details are as follows:  Membrane Rupture Time/Date: 8:10 AM,01/23/2023  Delivery Method:C-Section, Low Transverse Operative Delivery:N/A Details of operation can be found in separate operative note.  Patient had a postpartum course complicated by none.  She is ambulating, tolerating a regular diet, passing flatus, and urinating well. Patient is discharged home in stable condition on  01/25/23        Newborn Data: Birth date:01/23/2023 Birth time:8:11 AM Gender:Female Living status:Living Apgars:9 ,9  Weight:3300 g    Magnesium Sulfate received: No BMZ received: No Rhophylac:N/A MMR:No T-DaP:Given prenatally Flu: No Transfusion:Yes  Physical exam  Vitals:   01/24/23 0200 01/24/23 1303 01/24/23 2039 01/25/23 0514  BP: 110/68 104/71 (!) 128/58 120/75  Pulse: 89 66 67 61  Resp: 18 18 18 18   Temp: 98.2 F (36.8 C) 98 F (36.7 C) 97.9 F (36.6 C) 98 F (36.7 C)  TempSrc: Oral Oral Oral Oral  SpO2: 100% 99% 99% 100%  Weight:      Height:       General: alert, cooperative, and no distress Lochia: appropriate Uterine Fundus: firm Incision: Healing well with no significant drainage,  Dressing is clean, dry, and intact DVT Evaluation: No evidence of DVT seen on physical exam. Labs: Lab Results  Component Value Date   WBC 10.0 01/24/2023   HGB 10.5 (L) 01/24/2023   HCT 31.2 (L) 01/24/2023   MCV 100.0 01/24/2023   PLT 121 (L) 01/24/2023      Latest Ref Rng & Units 06/28/2022    8:09 AM  CMP  Glucose 70 - 99 mg/dL 98   BUN 6 - 20 mg/dL 10   Creatinine 3.29 - 1.00 mg/dL 5.18   Sodium 841 - 660 mmol/L 138   Potassium 3.5 - 5.1 mmol/L 4.3   Chloride 98 - 111 mmol/L 107   CO2 22 - 32 mmol/L 23   Calcium 8.9 - 10.3 mg/dL 8.7   Total Protein 6.5 - 8.1 g/dL 7.1   Total Bilirubin 0.3 - 1.2 mg/dL 0.9   Alkaline Phos 38 - 126 U/L 51   AST 15 - 41 U/L 18   ALT 0 - 44 U/L 17    Edinburgh Score:    01/23/2023    7:20 PM  Edinburgh Postnatal Depression Scale Screening Tool  I have been able to laugh and see the funny side of things. 0  I have looked forward with enjoyment to things. 0  I have blamed myself unnecessarily when things went wrong. 0  I have been anxious or worried for no good reason. 0  I have felt  scared or panicky for no good reason. 0  Things have been getting on top of me. 0  I have been so unhappy that I have had difficulty sleeping. 0  I have felt sad or miserable. 0  I have been so unhappy that I have been crying. 0  The thought of harming myself has occurred to me. 0  Edinburgh Postnatal Depression Scale Total 0      After visit meds:  Allergies as of 01/25/2023       Reactions   Amoxicillin-pot Clavulanate Nausea And Vomiting   vomiting   Bee Venom Hives   Other Anaphylaxis   peas        Medication List     TAKE these medications    acetaminophen 325 MG tablet Commonly known as: TYLENOL Take 2 tablets (650 mg total) by mouth every 4 (four) hours as needed for mild pain (temperature > 101.5.).   fluticasone 50 MCG/ACT nasal spray Commonly known as: FLONASE Place 2 sprays into both nostrils daily. What changed:  when to  take this reasons to take this   ibuprofen 600 MG tablet Commonly known as: ADVIL Take 1 tablet (600 mg total) by mouth every 6 (six) hours.   levocetirizine 5 MG tablet Commonly known as: XYZAL Take 1 tablet (5 mg total) by mouth every evening.   oxyCODONE 5 MG immediate release tablet Commonly known as: Roxicodone Take 1 tablet (5 mg total) by mouth every 4 (four) hours as needed for severe pain.   PRE-NATAL FORMULA PO Take 1 tablet by mouth daily.   senna-docusate 8.6-50 MG tablet Commonly known as: Senokot-S Take 2 tablets by mouth daily.   Systane Complete PF 0.6 % Soln Generic drug: Propylene Glycol (PF) Place 1 drop into both eyes daily at 12 noon.         Discharge home in stable condition Infant Feeding: Bottle and Breast Infant Disposition:home with mother Discharge instruction: per After Visit Summary and Postpartum booklet. Activity: Advance as tolerated. Pelvic rest for 6 weeks.  Diet: routine diet Anticipated Birth Control: Unsure Postpartum Appointment:6 weeks Additional Postpartum F/U:  none Future Appointments: Future Appointments  Date Time Provider Department Center  07/06/2023  8:20 AM Bethanie Dicker, NP [facility]    01/25/2023 Tawni Levy, MD

## 2023-01-25 NOTE — Lactation Note (Signed)
This note was copied from a baby's chart. Lactation Consultation Note  Patient Name: Kelly Crawford [id number] Date: 01/25/2023 Age:31 hours Reason for consult: Follow-up assessment  P2, First child in NICU.  Mother states she is offering the breast and supplementing with formula after.  Discussed supply and demand and the importance of feeding frequently to help establish mother's milk supply.  Mother agreeable to all teaching.   Provided mother with [employer] employee pump. Feed on demand with cues.  Goal 8-12+ times per day after first 24 hrs.   Reviewed engorgement care and monitoring voids/stools.   Maternal Data Does the patient have breastfeeding experience prior to this delivery?: Yes  Feeding Mother's Current Feeding Choice: Breast Milk and Formula Nipple Type: Slow - flow Lactation Tools Discussed/Used Tools: Pump  Interventions Interventions: Education  Discharge Discharge Education: Engorgement and breast care;Warning signs for feeding baby Pump: Employee Pump [employer] given)  Consult Status Consult Status: Complete Date: 01/25/23    Dahlia Byes [facility] 01/25/2023, 10:16 AM

## 2023-01-27 ENCOUNTER — Telehealth: Payer: 59 | Admitting: Nurse Practitioner

## 2023-01-27 DIAGNOSIS — J069 Acute upper respiratory infection, unspecified: Secondary | ICD-10-CM | POA: Diagnosis not present

## 2023-01-28 MED ORDER — PREDNISONE 10 MG PO TABS: 10.0000 mg | ORAL_TABLET | Freq: Every day | ORAL | 0 refills | Status: DC

## 2023-01-28 NOTE — Addendum Note (Signed)
Addended by: Bertram Denver on: 01/28/2023 08:21 AM   Modules accepted: Level of Service

## 2023-01-28 NOTE — Progress Notes (Signed)
I have spent 5 minutes in review of e-visit questionnaire, review and updating patient chart, medical decision making and response to patient.  ° °Jerrell Mangel W Secilia Apps, NP ° °  °

## 2023-01-28 NOTE — Progress Notes (Signed)
At this time with you recently giving birth 5 days ago we would not prescribe any antibiotics. If you feel antibiotics are warranted I would recommend speaking with your gynecologist or PCP tomorrow.   E-Visit for Cough  We are sorry that you are not feeling well.  Here is how we plan to help!  Based on your presentation I believe you most likely have A cough due to a virus.  This is called viral bronchitis and is best treated by rest, plenty of fluids and control of the cough.  You may use Ibuprofen or Tylenol as directed to help your symptoms.     In addition you may use honey with tea to control your cough. I will send a different nasal inhaler however you can also try saline irrigation at home to help control your symptoms. I have also sent a few days of prednisone to help with any inflammatory process that may be stimulating your cough. Prednisone 10 mg daily for 5 day  A viral URI can last up to 14 days. If you have been sick longer than 2 weeks and your sputum has changed colors I recommend following up in person.    From your responses in the eVisit questionnaire you describe inflammation in the upper respiratory tract which is causing a significant cough.  This is commonly called Bronchitis and has four common causes:   Allergies Viral Infections Acid Reflux Bacterial Infection Allergies, viruses and acid reflux are treated by controlling symptoms or eliminating the cause. An example might be a cough caused by taking certain blood pressure medications. You stop the cough by changing the medication. Another example might be a cough caused by acid reflux. Controlling the reflux helps control the cough.  USE OF BRONCHODILATOR ("RESCUE") INHALERS: There is a risk from using your bronchodilator too frequently.  The risk is that over-reliance on a medication which only relaxes the muscles surrounding the breathing tubes can reduce the effectiveness of medications prescribed to reduce  swelling and congestion of the tubes themselves.  Although you feel brief relief from the bronchodilator inhaler, your asthma may actually be worsening with the tubes becoming more swollen and filled with mucus.  This can delay other crucial treatments, such as oral steroid medications. If you need to use a bronchodilator inhaler daily, several times per day, you should discuss this with your provider.  There are probably better treatments that could be used to keep your asthma under control.     HOME CARE Only take medications as instructed by your medical team. Complete the entire course of an antibiotic. Drink plenty of fluids and get plenty of rest. Avoid close contacts especially the very young and the elderly Cover your mouth if you cough or cough into your sleeve. Always remember to wash your hands A steam or ultrasonic humidifier can help congestion.   GET HELP RIGHT AWAY IF: You develop worsening fever. You become short of breath You cough up blood. Your symptoms persist after you have completed your treatment plan MAKE SURE YOU  Understand these instructions. Will watch your condition. Will get help right away if you are not doing well or get worse.    Thank you for choosing an e-visit.  Your e-visit answers were reviewed by a board certified advanced clinical practitioner to complete your personal care plan. Depending upon the condition, your plan could have included both over the counter or prescription medications.  Please review your pharmacy choice. Make sure the pharmacy is open  so you can pick up prescription now. If there is a problem, you may contact your provider through [employer] and have the prescription routed to another pharmacy.  Your safety is important to [place]. If you have drug allergies check your prescription carefully.   For the next 24 hours you can use MyChart to ask questions about today's visit, request a non-urgent call back, or ask for a work or  school excuse. You will get an email in the next two days asking about your experience. I hope that your e-visit has been valuable and will speed your recovery.

## 2023-02-22 ENCOUNTER — Telehealth (HOSPITAL_COMMUNITY): Payer: Self-pay | Admitting: *Deleted

## 2023-02-22 NOTE — Telephone Encounter (Signed)
02/22/2023  Name: BRIANA FARNER MRN: [id number] DOB: 03/01/1991  Reason for Call:  Transition of Care Hospital Discharge Call  Contact Status: Patient Contact Status: Complete  Language assistant needed: Interpreter Mode: Interpreter Not Needed        Follow-Up Questions: Do You Have Any Concerns About Your Health As You Heal From Delivery?: No Do You Have Any Concerns About Your Infants Health?: No  Edinburgh Postnatal Depression Scale:  In the Past 7 Days:    PHQ2-9 Depression Scale:     Discharge Follow-up: Edinburgh score requires follow up?:  (declines screening today, took recently at peds office and there were no concerns, she endorses feeling well emotionally) Patient was advised of the following resources:: Breastfeeding Support Group, Support Group  Post-discharge interventions: Reviewed Newborn Safe Sleep Practices  Salena Saner, RN 02/22/2023 11:51

## 2023-03-01 ENCOUNTER — Other Ambulatory Visit: Payer: Self-pay

## 2023-03-01 MED ORDER — CEPHALEXIN 500 MG PO CAPS
500.0000 mg | ORAL_CAPSULE | Freq: Four times a day (QID) | ORAL | 0 refills | Status: DC
  Filled 2023-03-01: qty 28, 7d supply, fill #0

## 2023-03-06 ENCOUNTER — Other Ambulatory Visit: Payer: Self-pay

## 2023-03-06 DIAGNOSIS — Z309 Encounter for contraceptive management, unspecified: Secondary | ICD-10-CM | POA: Diagnosis not present

## 2023-03-06 DIAGNOSIS — Z1389 Encounter for screening for other disorder: Secondary | ICD-10-CM | POA: Diagnosis not present

## 2023-03-06 MED ORDER — NORETHINDRONE 0.35 MG PO TABS
1.0000 | ORAL_TABLET | Freq: Every day | ORAL | 3 refills | Status: DC
  Filled 2023-03-06: qty 84, 84d supply, fill #0
  Filled 2023-07-22: qty 84, 84d supply, fill #1

## 2023-05-01 ENCOUNTER — Other Ambulatory Visit: Payer: Self-pay

## 2023-05-11 ENCOUNTER — Other Ambulatory Visit: Payer: Self-pay

## 2023-05-11 ENCOUNTER — Telehealth: Payer: 59 | Admitting: Emergency Medicine

## 2023-05-11 DIAGNOSIS — J111 Influenza due to unidentified influenza virus with other respiratory manifestations: Secondary | ICD-10-CM

## 2023-05-11 MED ORDER — OSELTAMIVIR PHOSPHATE 75 MG PO CAPS
75.0000 mg | ORAL_CAPSULE | Freq: Two times a day (BID) | ORAL | 0 refills | Status: AC
  Filled 2023-05-11: qty 10, 5d supply, fill #0

## 2023-05-11 NOTE — Progress Notes (Signed)
E visit for Flu like symptoms   We are sorry that you are not feeling well.  Here is how we plan to help! Based on what you have shared with me it looks like you may have a respiratory virus that may be influenza.  Influenza or "the flu" is   an infection caused by a respiratory virus. The flu virus is highly contagious and persons who did not receive their yearly flu vaccination may "catch" the flu from close contact.  We have anti-viral medications to treat the viruses that cause this infection. They are not a "cure" and only shorten the course of the infection. These prescriptions are most effective when they are given within the first 2 days of "flu" symptoms. Antiviral medication are indicated if you have a high risk of complications from the flu. You should  also consider an antiviral medication if you are in close contact with someone who is at risk. These medications can help patients avoid complications from the flu  but have side effects that you should know. Possible side effects from Tamiflu or oseltamivir include nausea, vomiting, diarrhea, dizziness, headaches, eye redness, sleep problems or other respiratory symptoms. You should not take Tamiflu if you have an allergy to oseltamivir or any to the ingredients in Tamiflu.  Based upon your symptoms and potential risk factors I have prescribed Oseltamivir (Tamiflu).  It has been sent to your designated pharmacy.  You will take one 75 mg capsule orally twice a day for the next 5 days.  ANYONE WHO HAS FLU SYMPTOMS SHOULD: Stay home. The flu is highly contagious and going out or to work exposes others! Be sure to drink plenty of fluids. Water is fine as well as fruit juices, sodas and electrolyte beverages. You may want to stay away from caffeine or alcohol. If you are nauseated, try taking small sips of liquids. How do you know if you are getting enough fluid? Your urine should be a pale yellow or almost colorless. Get rest. Taking a steamy  shower or using a humidifier may help nasal congestion and ease sore throat pain. Using a saline nasal spray works much the same way. Cough drops, hard candies and sore throat lozenges may ease your cough. Line up a caregiver. Have someone check on you regularly.   GET HELP RIGHT AWAY IF: You cannot keep down liquids or your medications. You become short of breath Your fell like you are going to pass out or loose consciousness. Your symptoms persist after you have completed your treatment plan MAKE SURE YOU  Understand these instructions. Will watch your condition. Will get help right away if you are not doing well or get worse.  Your e-visit answers were reviewed by a board certified advanced clinical practitioner to complete your personal care plan.  Depending on the condition, your plan could have included both over the counter or prescription medications.  If there is a problem please reply  once you have received a response from your provider.  Your safety is important to us.  If you have drug allergies check your prescription carefully.    You can use MyChart to ask questions about today's visit, request a non-urgent call back, or ask for a work or school excuse for 24 hours related to this e-Visit. If it has been greater than 24 hours you will need to follow up with your provider, or enter a new e-Visit to address those concerns.  You will get an e-mail in the next   two days asking about your experience.  I hope that your e-visit has been valuable and will speed your recovery. Thank you for using e-visits.   Approximately 5 minutes was used in reviewing the patient's chart, questionnaire, prescribing medications, and documentation.  

## 2023-07-05 NOTE — Progress Notes (Unsigned)
Bethanie Dicker, NP-C Phone: [phone]  Kelly Crawford is a 33 y.o. female who presents today for annual exam.   Discussed the use of AI scribe software for clinical note transcription with the patient, who gave verbal consent to proceed.  History of Present Illness   Kelly Crawford is a 33 year old female who presents for an annual physical exam.  She is currently postpartum, having given birth to a baby girl in September. She is breastfeeding, primarily pumping during the day and nursing at night. No current health concerns or issues are reported. She has not resumed menstruation since giving birth. No chest pain, shortness of breath, abdominal pain, urinary issues, bleeding, headaches, dizziness, skin changes, joint pain, mood changes, anxiety, or depression. She sleeps well, with her baby sleeping through the night.  There is a family history of breast cancer, with her mother diagnosed in 2020 in her fifties. She underwent genetic testing at that time, which was negative for any known genetic mutations.  She denies smoking, alcohol, or drug use. She is up to date on her flu and tetanus vaccinations but has not received a COVID vaccine. She continues to see her dentist and eye doctor regularly. She does not engage in formal exercise but stays active by caring for her children. She is mindful of her diet but notes increased snacking due to breastfeeding. She has a history of low vitamin D and high cholesterol.  Recently, she smashed her finger, resulting in a numb sensation and tenderness. The injury occurred about a week ago, and while it was initially painful, the pain has subsided. The nail is not lifted from the bed, and she is monitoring the situation. It has been gradually improving.      Social History   Tobacco Use  Smoking Status Never  Smokeless Tobacco Never    Current Outpatient Medications on File Prior to Visit  Medication Sig Dispense Refill   levocetirizine  (XYZAL) 5 MG tablet Take 1 tablet (5 mg total) by mouth every evening. 90 tablet 3   norethindrone (CAMILA) 0.35 MG tablet Take 1 tablet (0.35 mg total) by mouth daily. 84 tablet 3   Prenatal Multivit-Min-Fe-FA (PRE-NATAL FORMULA PO) Take 1 tablet by mouth daily.     Propylene Glycol, PF, (SYSTANE COMPLETE PF) 0.6 % SOLN Place 1 drop into both eyes daily at 12 noon.     fluticasone (FLONASE) 50 MCG/ACT nasal spray Place 2 sprays into both nostrils daily. (Patient taking differently: Place 2 sprays into both nostrils daily as needed for allergies.) 16 g 5   [DISCONTINUED] etonogestrel-ethinyl estradiol (NUVARING) 0.12-0.015 MG/24HR vaginal ring INSERT 1 RING VAGINALLY EVERY MONTH AS DIRECTED 3 each 4   No current facility-administered medications on file prior to visit.    ROS see history of present illness  Objective  Physical Exam Vitals:   07/06/23 0811  BP: 102/64  Pulse: 60  Temp: 98 F (36.7 C)  SpO2: 96%    BP Readings from Last 3 Encounters:  07/06/23 102/64  01/25/23 120/75  06/30/22 118/60   Wt Readings from Last 3 Encounters:  07/06/23 179 lb 6.4 oz (81.4 kg)  01/23/23 216 lb 1.6 oz (98 kg)  01/10/23 214 lb (97.1 kg)    Physical Exam Constitutional:      General: She is not in acute distress.    Appearance: Normal appearance.  HENT:     Head: Normocephalic.     Right Ear: Tympanic membrane normal.  Left Ear: Tympanic membrane normal.     Nose: Nose normal.     Mouth/Throat:     Mouth: Mucous membranes are moist.     Pharynx: Oropharynx is clear.  Eyes:     Conjunctiva/sclera: Conjunctivae normal.     Pupils: Pupils are equal, round, and reactive to light.  Neck:     Thyroid: No thyromegaly.  Cardiovascular:     Rate and Rhythm: Normal rate and regular rhythm.     Heart sounds: Normal heart sounds.  Pulmonary:     Effort: Pulmonary effort is normal.     Breath sounds: Normal breath sounds.  Abdominal:     General: Abdomen is flat. Bowel sounds  are normal.     Palpations: Abdomen is soft. There is no mass.     Tenderness: There is no abdominal tenderness.  Musculoskeletal:        General: Normal range of motion.  Lymphadenopathy:     Cervical: No cervical adenopathy.  Skin:    General: Skin is warm and dry.     Findings: No rash.     Comments: Right ring finger with subungual hematoma. Nail intact. No tenderness.   Neurological:     General: No focal deficit present.     Mental Status: She is alert.  Psychiatric:        Mood and Affect: Mood normal.        Behavior: Behavior normal.    Assessment/Plan: Please see individual problem list.  Preventative health care Assessment & Plan: Physical exam complete. Breastfeeding and pumping are proceeding without issues. There are no mood changes or signs of postpartum depression, and menstruation has not resumed since delivery. A Pap smear is due in March. Vaccinations for flu and tetanus are up to date, but COVID vaccines have not been received. There is no smoking, alcohol, or drug use, and regular dental and eye check-ups are maintained. Schedule a Pap smear with OB/GYN in March and order routine blood work to monitor cholesterol and vitamin D levels. Encouraged healthy diet and remaining active. Return to care in one year, sooner as needed.   Orders: -     CBC with Differential/Platelet -     Comprehensive metabolic panel  Subungual hematoma of right ring finger Assessment & Plan: A finger injury resulted in a subungual hematoma with a numb sensation but no significant pain. Monitor for natural resolution. If pain increases, consider medical intervention to relieve pressure.   Hyperlipidemia, unspecified hyperlipidemia type Assessment & Plan: Diet controlled. Statin therapy not indicated at this time. Encouraged healthy diet and exercise. We will check lipid panel today.   Orders: -     Lipid panel  Vitamin D deficiency -     VITAMIN D 25 Hydroxy (Vit-D Deficiency,  Fractures)  Thyroid disorder screen -     TSH    Return in about 1 year (around 07/05/2024) for Annual Exam, sooner as needed.   Bethanie Dicker, NP-C [facility]

## 2023-07-06 ENCOUNTER — Ambulatory Visit: Payer: 59 | Admitting: Nurse Practitioner

## 2023-07-06 ENCOUNTER — Encounter: Payer: Self-pay | Admitting: Nurse Practitioner

## 2023-07-06 ENCOUNTER — Encounter: Payer: 59 | Admitting: Nurse Practitioner

## 2023-07-06 VITALS — BP 102/64 | HR 60 | Temp 98.0°F | Ht 60.0 in | Wt 179.4 lb

## 2023-07-06 DIAGNOSIS — S60141A Contusion of right ring finger with damage to nail, initial encounter: Secondary | ICD-10-CM

## 2023-07-06 DIAGNOSIS — Z0000 Encounter for general adult medical examination without abnormal findings: Secondary | ICD-10-CM

## 2023-07-06 DIAGNOSIS — E559 Vitamin D deficiency, unspecified: Secondary | ICD-10-CM | POA: Diagnosis not present

## 2023-07-06 DIAGNOSIS — E785 Hyperlipidemia, unspecified: Secondary | ICD-10-CM

## 2023-07-06 DIAGNOSIS — Z1329 Encounter for screening for other suspected endocrine disorder: Secondary | ICD-10-CM | POA: Diagnosis not present

## 2023-07-06 LAB — TSH: TSH: 1.45 u[IU]/mL (ref 0.35–5.50)

## 2023-07-06 LAB — CBC WITH DIFFERENTIAL/PLATELET
Basophils Absolute: 0 10*3/uL (ref 0.0–0.1)
Basophils Relative: 0.9 % (ref 0.0–3.0)
Eosinophils Absolute: 0.1 10*3/uL (ref 0.0–0.7)
Eosinophils Relative: 4 % (ref 0.0–5.0)
HCT: 40.4 % (ref 36.0–46.0)
Hemoglobin: 13.5 g/dL (ref 12.0–15.0)
Lymphocytes Relative: 42.8 % (ref 12.0–46.0)
Lymphs Abs: 1.5 10*3/uL (ref 0.7–4.0)
MCHC: 33.4 g/dL (ref 30.0–36.0)
MCV: 95.2 fL (ref 78.0–100.0)
Monocytes Absolute: 0.3 10*3/uL (ref 0.1–1.0)
Monocytes Relative: 8.3 % (ref 3.0–12.0)
Neutro Abs: 1.5 10*3/uL (ref 1.4–7.7)
Neutrophils Relative %: 44 % (ref 43.0–77.0)
Platelets: 209 10*3/uL (ref 150.0–400.0)
RBC: 4.25 Mil/uL (ref 3.87–5.11)
RDW: 13 % (ref 11.5–15.5)
WBC: 3.5 10*3/uL — ABNORMAL LOW (ref 4.0–10.5)

## 2023-07-06 LAB — COMPREHENSIVE METABOLIC PANEL
ALT: 21 U/L (ref 0–35)
AST: 18 U/L (ref 0–37)
Albumin: 4.5 g/dL (ref 3.5–5.2)
Alkaline Phosphatase: 76 U/L (ref 39–117)
BUN: 16 mg/dL (ref 6–23)
CO2: 26 meq/L (ref 19–32)
Calcium: 9.2 mg/dL (ref 8.4–10.5)
Chloride: 106 meq/L (ref 96–112)
Creatinine, Ser: 0.74 mg/dL (ref 0.40–1.20)
GFR: 107.64 mL/min (ref >60.00)
Glucose, Bld: 86 mg/dL (ref 70–99)
Potassium: 4 meq/L (ref 3.5–5.1)
Sodium: 139 meq/L (ref 135–145)
Total Bilirubin: 1.2 mg/dL (ref 0.2–1.2)
Total Protein: 7.5 g/dL (ref 6.0–8.3)

## 2023-07-06 LAB — LIPID PANEL
Cholesterol: 161 mg/dL (ref 0–200)
HDL: 60.4 mg/dL (ref >39.00)
LDL Cholesterol: 90 mg/dL (ref 0–99)
NonHDL: 100.84
Total CHOL/HDL Ratio: 3
Triglycerides: 52 mg/dL (ref 0.0–149.0)
VLDL: 10.4 mg/dL (ref 0.0–40.0)

## 2023-07-06 LAB — VITAMIN D 25 HYDROXY (VIT D DEFICIENCY, FRACTURES): VITD: 28.31 ng/mL — ABNORMAL LOW (ref 30.00–100.00)

## 2023-07-06 NOTE — Assessment & Plan Note (Addendum)
Physical exam complete. Breastfeeding and pumping are proceeding without issues. There are no mood changes or signs of postpartum depression, and menstruation has not resumed since delivery. A Pap smear is due in March. Vaccinations for flu and tetanus are up to date, but COVID vaccines have not been received. There is no smoking, alcohol, or drug use, and regular dental and eye check-ups are maintained. Schedule a Pap smear with OB/GYN in March and order routine blood work to monitor cholesterol and vitamin D levels. Encouraged healthy diet and remaining active. Return to care in one year, sooner as needed.

## 2023-07-06 NOTE — Assessment & Plan Note (Signed)
A finger injury resulted in a subungual hematoma with a numb sensation but no significant pain. Monitor for natural resolution. If pain increases, consider medical intervention to relieve pressure.

## 2023-07-06 NOTE — Assessment & Plan Note (Signed)
Diet controlled. Statin therapy not indicated at this time. Encouraged healthy diet and exercise. We will check lipid panel today.

## 2023-07-22 ENCOUNTER — Other Ambulatory Visit: Payer: Self-pay | Admitting: Nurse Practitioner

## 2023-07-22 ENCOUNTER — Other Ambulatory Visit: Payer: Self-pay

## 2023-07-22 DIAGNOSIS — J309 Allergic rhinitis, unspecified: Secondary | ICD-10-CM

## 2023-07-23 ENCOUNTER — Other Ambulatory Visit: Payer: Self-pay

## 2023-07-23 MED ORDER — FLUTICASONE PROPIONATE 50 MCG/ACT NA SUSP
2.0000 | Freq: Every day | NASAL | 5 refills | Status: AC
  Filled 2023-07-23: qty 16, 30d supply, fill #0
  Filled 2024-02-04: qty 16, 30d supply, fill #1
  Filled 2024-05-19: qty 16, 30d supply, fill #2

## 2023-09-07 ENCOUNTER — Other Ambulatory Visit: Payer: Self-pay

## 2023-09-07 DIAGNOSIS — Z6835 Body mass index (BMI) 35.0-35.9, adult: Secondary | ICD-10-CM | POA: Diagnosis not present

## 2023-09-07 DIAGNOSIS — Z01419 Encounter for gynecological examination (general) (routine) without abnormal findings: Secondary | ICD-10-CM | POA: Diagnosis not present

## 2023-09-07 DIAGNOSIS — Z124 Encounter for screening for malignant neoplasm of cervix: Secondary | ICD-10-CM | POA: Diagnosis not present

## 2023-09-07 DIAGNOSIS — Z3044 Encounter for surveillance of vaginal ring hormonal contraceptive device: Secondary | ICD-10-CM | POA: Diagnosis not present

## 2023-09-07 DIAGNOSIS — Z1151 Encounter for screening for human papillomavirus (HPV): Secondary | ICD-10-CM | POA: Diagnosis not present

## 2023-09-07 MED ORDER — ETONOGESTREL-ETHINYL ESTRADIOL 0.12-0.015 MG/24HR VA RING
1.0000 | VAGINAL_RING | VAGINAL | 4 refills | Status: AC
  Filled 2023-09-07: qty 3, 84d supply, fill #0
  Filled 2024-02-04: qty 3, 84d supply, fill #1
  Filled 2024-05-19: qty 3, 84d supply, fill #2

## 2023-10-02 DIAGNOSIS — L820 Inflamed seborrheic keratosis: Secondary | ICD-10-CM | POA: Diagnosis not present

## 2023-10-02 DIAGNOSIS — D485 Neoplasm of uncertain behavior of skin: Secondary | ICD-10-CM | POA: Diagnosis not present

## 2023-10-18 ENCOUNTER — Other Ambulatory Visit: Payer: Self-pay | Admitting: Nurse Practitioner

## 2023-10-18 ENCOUNTER — Other Ambulatory Visit: Payer: Self-pay

## 2023-10-18 DIAGNOSIS — J309 Allergic rhinitis, unspecified: Secondary | ICD-10-CM

## 2023-10-18 MED ORDER — LEVOCETIRIZINE DIHYDROCHLORIDE 5 MG PO TABS
5.0000 mg | ORAL_TABLET | Freq: Every evening | ORAL | 3 refills | Status: AC
  Filled 2023-10-18: qty 90, 90d supply, fill #0
  Filled 2024-02-04: qty 90, 90d supply, fill #1
  Filled 2024-05-19: qty 90, 90d supply, fill #2

## 2023-10-27 ENCOUNTER — Encounter

## 2023-11-21 ENCOUNTER — Encounter

## 2024-02-04 ENCOUNTER — Other Ambulatory Visit: Payer: Self-pay

## 2024-02-05 ENCOUNTER — Telehealth: Admitting: Physician Assistant

## 2024-02-05 DIAGNOSIS — R3989 Other symptoms and signs involving the genitourinary system: Secondary | ICD-10-CM | POA: Diagnosis not present

## 2024-02-06 ENCOUNTER — Other Ambulatory Visit: Payer: Self-pay

## 2024-02-06 MED ORDER — NITROFURANTOIN MONOHYD MACRO 100 MG PO CAPS
100.0000 mg | ORAL_CAPSULE | Freq: Two times a day (BID) | ORAL | 0 refills | Status: AC
  Filled 2024-02-06: qty 10, 5d supply, fill #0

## 2024-02-06 NOTE — Progress Notes (Signed)

## 2024-03-14 DIAGNOSIS — H5201 Hypermetropia, right eye: Secondary | ICD-10-CM | POA: Diagnosis not present

## 2024-03-28 ENCOUNTER — Other Ambulatory Visit: Payer: Self-pay

## 2024-03-28 MED ORDER — PREVIDENT 5000 BOOSTER PLUS 1.1 % DT PSTE
PASTE | DENTAL | 12 refills | Status: AC
Start: 2024-03-28 — End: ?
  Filled 2024-03-28: qty 100, 30d supply, fill #0

## 2024-04-08 ENCOUNTER — Ambulatory Visit: Payer: Self-pay

## 2024-04-08 DIAGNOSIS — M545 Low back pain, unspecified: Secondary | ICD-10-CM | POA: Diagnosis not present

## 2024-04-08 DIAGNOSIS — N390 Urinary tract infection, site not specified: Secondary | ICD-10-CM | POA: Diagnosis not present

## 2024-04-08 DIAGNOSIS — R300 Dysuria: Secondary | ICD-10-CM | POA: Diagnosis not present

## 2024-04-08 DIAGNOSIS — R350 Frequency of micturition: Secondary | ICD-10-CM | POA: Diagnosis not present

## 2024-04-08 NOTE — Telephone Encounter (Signed)
 FYI Only or Action Required?: FYI only for provider: appointment scheduled on 04/09/24.  Patient was last seen in primary care on 07/06/2023 by Gretel App, NP.  Called Nurse Triage reporting Dysuria.  Symptoms began yesterday.  Interventions attempted: OTC medications: Azo, cranberry juice.  Symptoms are: stable.  Triage Disposition: See Physician Within 24 Hours  Patient/caregiver understands and will follow disposition?: Yes Reason for Disposition  More than 2 UTI's in last year  Answer Assessment - Initial Assessment Questions Azo cranberry tablets, and drinking more water + cranberry juice  1. SEVERITY: How bad is the pain?  (e.g., Scale 1-10; mild, moderate, or severe)     Moderate  2. PATTERN: Is pain present every time you urinate or just sometimes?      Off and on  3. ONSET: When did the painful urination start?      Yesterday  4. FEVER: Do you have a fever? If Yes, ask: What is your temperature, how was it measured, and when did it start?     Unsure  5. PAST UTI: Have you had a urine infection before? If Yes, ask: When was the last time? and What happened that time?      2 months ago  6. CAUSE: What do you think is causing the painful urination?  (e.g., UTI, scratch, Herpes sore)     UTI  7. OTHER SYMPTOMS: Do you have any other symptoms? (e.g., blood in urine, flank pain, genital sores, urgency, vaginal discharge)     Urinary frequency, mid back pain on both sides  Protocols used: Urination Pain - [facility]  Copied from CRM #[id number]. Topic: Clinical - Red Word Triage >> Apr 08, 2024  3:15 PM Mesmerise C wrote: [facility] that prompted transfer to Nurse Triage: Patient states she believes she may have a UTI been experiencing, fatigue, back pain, hot and cold, request urination

## 2024-04-09 ENCOUNTER — Ambulatory Visit: Admitting: Family Medicine

## 2024-05-27 ENCOUNTER — Other Ambulatory Visit: Payer: Self-pay

## 2024-07-11 ENCOUNTER — Encounter: Payer: 59 | Admitting: Nurse Practitioner

## 2024-07-25 ENCOUNTER — Encounter: Admitting: Nurse Practitioner
